# Patient Record
Sex: Male | Born: 1935 | Race: White | Hispanic: No | Marital: Married
Health system: Southern US, Community
[De-identification: ages and names within clinical notes are randomized; demographics above are authoritative.]

---

## 2005-06-28 ENCOUNTER — Ambulatory Visit: Payer: Self-pay | Admitting: Ophthalmology

## 2005-07-03 ENCOUNTER — Ambulatory Visit: Payer: Self-pay | Admitting: Ophthalmology

## 2009-03-24 ENCOUNTER — Ambulatory Visit: Payer: Self-pay | Admitting: Internal Medicine

## 2009-03-31 ENCOUNTER — Ambulatory Visit: Payer: Self-pay | Admitting: Internal Medicine

## 2009-04-07 ENCOUNTER — Ambulatory Visit: Payer: Self-pay | Admitting: Internal Medicine

## 2009-04-21 ENCOUNTER — Ambulatory Visit: Payer: Self-pay | Admitting: Internal Medicine

## 2009-04-28 ENCOUNTER — Ambulatory Visit: Payer: Self-pay | Admitting: Internal Medicine

## 2009-05-12 ENCOUNTER — Ambulatory Visit: Payer: Self-pay | Admitting: Internal Medicine

## 2009-05-19 ENCOUNTER — Ambulatory Visit: Payer: Self-pay | Admitting: Internal Medicine

## 2009-06-02 ENCOUNTER — Ambulatory Visit: Payer: Self-pay | Admitting: Internal Medicine

## 2009-06-09 ENCOUNTER — Ambulatory Visit: Payer: Self-pay | Admitting: Internal Medicine

## 2009-06-23 ENCOUNTER — Ambulatory Visit: Payer: Self-pay | Admitting: Internal Medicine

## 2009-07-07 ENCOUNTER — Ambulatory Visit: Payer: Self-pay | Admitting: Internal Medicine

## 2010-09-06 ENCOUNTER — Ambulatory Visit: Payer: Self-pay | Admitting: Unknown Physician Specialty

## 2012-08-23 ENCOUNTER — Ambulatory Visit: Payer: Self-pay | Admitting: Family Medicine

## 2012-09-01 ENCOUNTER — Ambulatory Visit: Payer: Self-pay

## 2013-07-06 ENCOUNTER — Ambulatory Visit: Payer: Self-pay | Admitting: General Surgery

## 2013-07-09 ENCOUNTER — Ambulatory Visit: Payer: Self-pay | Admitting: General Surgery

## 2013-08-02 ENCOUNTER — Ambulatory Visit: Payer: Self-pay | Admitting: General Surgery

## 2013-09-28 ENCOUNTER — Ambulatory Visit: Payer: Self-pay

## 2013-09-29 ENCOUNTER — Encounter: Payer: Self-pay | Admitting: Surgery

## 2013-09-30 ENCOUNTER — Encounter: Payer: Self-pay | Admitting: Surgery

## 2013-10-10 LAB — WOUND AEROBIC CULTURE

## 2013-12-03 ENCOUNTER — Encounter: Payer: Self-pay | Admitting: Surgery

## 2013-12-10 ENCOUNTER — Other Ambulatory Visit: Payer: Self-pay | Admitting: Surgery

## 2013-12-10 LAB — HEMOGLOBIN A1C: Hemoglobin A1C: 6.2 % (ref 4.2–6.3)

## 2013-12-22 ENCOUNTER — Inpatient Hospital Stay: Payer: Self-pay | Admitting: Specialist

## 2013-12-23 LAB — CBC WITH DIFFERENTIAL/PLATELET
BASOS PCT: 0.8 %
Basophil #: 0.1 10*3/uL (ref 0.0–0.1)
EOS PCT: 2.4 %
Eosinophil #: 0.2 10*3/uL (ref 0.0–0.7)
HCT: 28.4 % — ABNORMAL LOW (ref 40.0–52.0)
HGB: 9.5 g/dL — ABNORMAL LOW (ref 13.0–18.0)
Lymphocyte #: 1 10*3/uL (ref 1.0–3.6)
Lymphocyte %: 14.3 %
MCH: 30.5 pg (ref 26.0–34.0)
MCHC: 33.5 g/dL (ref 32.0–36.0)
MCV: 91 fL (ref 80–100)
Monocyte #: 0.6 x10 3/mm (ref 0.2–1.0)
Monocyte %: 9.1 %
NEUTROS ABS: 4.9 10*3/uL (ref 1.4–6.5)
Neutrophil %: 73.4 %
Platelet: 316 10*3/uL (ref 150–440)
RBC: 3.12 10*6/uL — ABNORMAL LOW (ref 4.40–5.90)
RDW: 12.6 % (ref 11.5–14.5)
WBC: 6.7 10*3/uL (ref 3.8–10.6)

## 2013-12-23 LAB — BASIC METABOLIC PANEL
Anion Gap: 5 — ABNORMAL LOW (ref 7–16)
BUN: 16 mg/dL (ref 7–18)
CO2: 26 mmol/L (ref 21–32)
Calcium, Total: 8.2 mg/dL — ABNORMAL LOW (ref 8.5–10.1)
Chloride: 104 mmol/L (ref 98–107)
Creatinine: 0.91 mg/dL (ref 0.60–1.30)
GLUCOSE: 81 mg/dL (ref 65–99)
Osmolality: 270 (ref 275–301)
POTASSIUM: 4 mmol/L (ref 3.5–5.1)
Sodium: 135 mmol/L — ABNORMAL LOW (ref 136–145)

## 2013-12-25 LAB — PATHOLOGY REPORT

## 2013-12-27 LAB — CULTURE, BLOOD (SINGLE)

## 2013-12-27 LAB — WOUND CULTURE

## 2014-05-05 ENCOUNTER — Emergency Department: Payer: Self-pay | Admitting: Emergency Medicine

## 2014-05-05 LAB — CBC
HCT: 36.7 % — ABNORMAL LOW (ref 40.0–52.0)
HGB: 11.8 g/dL — ABNORMAL LOW (ref 13.0–18.0)
MCH: 30.4 pg (ref 26.0–34.0)
MCHC: 32.3 g/dL (ref 32.0–36.0)
MCV: 94 fL (ref 80–100)
PLATELETS: 282 10*3/uL (ref 150–440)
RBC: 3.9 10*6/uL — AB (ref 4.40–5.90)
RDW: 13.1 % (ref 11.5–14.5)
WBC: 12.4 10*3/uL — AB (ref 3.8–10.6)

## 2014-05-05 LAB — COMPREHENSIVE METABOLIC PANEL
AST: 17 U/L (ref 15–37)
Albumin: 2.7 g/dL — ABNORMAL LOW (ref 3.4–5.0)
Alkaline Phosphatase: 164 U/L — ABNORMAL HIGH
Anion Gap: 7 (ref 7–16)
BILIRUBIN TOTAL: 0.7 mg/dL (ref 0.2–1.0)
BUN: 18 mg/dL (ref 7–18)
CALCIUM: 8.4 mg/dL — AB (ref 8.5–10.1)
CHLORIDE: 99 mmol/L (ref 98–107)
CO2: 26 mmol/L (ref 21–32)
Creatinine: 0.91 mg/dL (ref 0.60–1.30)
EGFR (African American): >60
Glucose: 109 mg/dL — ABNORMAL HIGH (ref 65–99)
OSMOLALITY: 267 (ref 275–301)
Potassium: 4.1 mmol/L (ref 3.5–5.1)
SGPT (ALT): 11 U/L — ABNORMAL LOW
Sodium: 132 mmol/L — ABNORMAL LOW (ref 136–145)
Total Protein: 6.7 g/dL (ref 6.4–8.2)

## 2014-05-05 LAB — URINALYSIS, COMPLETE
Bacteria: NONE SEEN
Bilirubin,UR: NEGATIVE
Blood: NEGATIVE
Glucose,UR: NEGATIVE mg/dL (ref 0–75)
Leukocyte Esterase: NEGATIVE
Nitrite: NEGATIVE
PH: 6 (ref 4.5–8.0)
Protein: NEGATIVE
RBC,UR: 1 /HPF (ref 0–5)
Specific Gravity: 1.019 (ref 1.003–1.030)
Squamous Epithelial: NONE SEEN
WBC UR: 1 /HPF (ref 0–5)

## 2014-05-05 LAB — LIPASE, BLOOD: Lipase: 47 U/L — ABNORMAL LOW (ref 73–393)

## 2014-05-06 ENCOUNTER — Inpatient Hospital Stay: Payer: Self-pay | Admitting: Podiatry

## 2014-05-06 LAB — CBC WITH DIFFERENTIAL/PLATELET
BASOS PCT: 0.2 %
Basophil #: 0 10*3/uL (ref 0.0–0.1)
Eosinophil #: 0 10*3/uL (ref 0.0–0.7)
Eosinophil %: 0 %
HCT: 34.9 % — ABNORMAL LOW (ref 40.0–52.0)
HGB: 11.3 g/dL — ABNORMAL LOW (ref 13.0–18.0)
Lymphocyte #: 0.2 10*3/uL — ABNORMAL LOW (ref 1.0–3.6)
Lymphocyte %: 1.4 %
MCH: 30.6 pg (ref 26.0–34.0)
MCHC: 32.4 g/dL (ref 32.0–36.0)
MCV: 94 fL (ref 80–100)
MONO ABS: 0.6 x10 3/mm (ref 0.2–1.0)
Monocyte %: 4.4 %
NEUTROS PCT: 94 %
Neutrophil #: 12.4 10*3/uL — ABNORMAL HIGH (ref 1.4–6.5)
Platelet: 243 10*3/uL (ref 150–440)
RBC: 3.7 10*6/uL — ABNORMAL LOW (ref 4.40–5.90)
RDW: 13.1 % (ref 11.5–14.5)
WBC: 13.2 10*3/uL — ABNORMAL HIGH (ref 3.8–10.6)

## 2014-05-06 LAB — COMPREHENSIVE METABOLIC PANEL
Albumin: 2.4 g/dL — ABNORMAL LOW (ref 3.4–5.0)
Alkaline Phosphatase: 157 U/L — ABNORMAL HIGH
Anion Gap: 10 (ref 7–16)
BUN: 25 mg/dL — ABNORMAL HIGH (ref 7–18)
Bilirubin,Total: 1 mg/dL (ref 0.2–1.0)
CALCIUM: 7.9 mg/dL — AB (ref 8.5–10.1)
CO2: 25 mmol/L (ref 21–32)
Chloride: 96 mmol/L — ABNORMAL LOW (ref 98–107)
Creatinine: 1.14 mg/dL (ref 0.60–1.30)
EGFR (African American): >60
EGFR (Non-African Amer.): >60
Glucose: 131 mg/dL — ABNORMAL HIGH (ref 65–99)
OSMOLALITY: 269 (ref 275–301)
Potassium: 4.2 mmol/L (ref 3.5–5.1)
SGOT(AST): 32 U/L (ref 15–37)
SGPT (ALT): 17 U/L
SODIUM: 131 mmol/L — AB (ref 136–145)
Total Protein: 6.3 g/dL — ABNORMAL LOW (ref 6.4–8.2)

## 2014-05-06 LAB — SEDIMENTATION RATE: Erythrocyte Sed Rate: 58 mm/hr — ABNORMAL HIGH (ref 0–20)

## 2014-05-07 LAB — CBC WITH DIFFERENTIAL/PLATELET
Basophil #: 0 10*3/uL (ref 0.0–0.1)
Basophil %: 0.1 %
EOS ABS: 0 10*3/uL (ref 0.0–0.7)
Eosinophil %: 0 %
HCT: 31.4 % — ABNORMAL LOW (ref 40.0–52.0)
HGB: 10.5 g/dL — ABNORMAL LOW (ref 13.0–18.0)
LYMPHS ABS: 0.2 10*3/uL — AB (ref 1.0–3.6)
Lymphocyte %: 2.2 %
MCH: 31.5 pg (ref 26.0–34.0)
MCHC: 33.5 g/dL (ref 32.0–36.0)
MCV: 94 fL (ref 80–100)
Monocyte #: 0.4 x10 3/mm (ref 0.2–1.0)
Monocyte %: 4 %
NEUTROS ABS: 9.8 10*3/uL — AB (ref 1.4–6.5)
Neutrophil %: 93.7 %
Platelet: 191 10*3/uL (ref 150–440)
RBC: 3.34 10*6/uL — ABNORMAL LOW (ref 4.40–5.90)
RDW: 13.4 % (ref 11.5–14.5)
WBC: 10.4 10*3/uL (ref 3.8–10.6)

## 2014-05-07 LAB — BASIC METABOLIC PANEL
ANION GAP: 9 (ref 7–16)
BUN: 26 mg/dL — AB (ref 7–18)
CHLORIDE: 96 mmol/L — AB (ref 98–107)
CO2: 25 mmol/L (ref 21–32)
Calcium, Total: 7.7 mg/dL — ABNORMAL LOW (ref 8.5–10.1)
Creatinine: 1.08 mg/dL (ref 0.60–1.30)
Glucose: 92 mg/dL (ref 65–99)
OSMOLALITY: 265 (ref 275–301)
Potassium: 3.8 mmol/L (ref 3.5–5.1)
Sodium: 130 mmol/L — ABNORMAL LOW (ref 136–145)

## 2014-05-08 DIAGNOSIS — I341 Nonrheumatic mitral (valve) prolapse: Secondary | ICD-10-CM

## 2014-05-08 LAB — BASIC METABOLIC PANEL
Anion Gap: 7 (ref 7–16)
BUN: 26 mg/dL — ABNORMAL HIGH (ref 7–18)
Calcium, Total: 7.6 mg/dL — ABNORMAL LOW (ref 8.5–10.1)
Chloride: 96 mmol/L — ABNORMAL LOW (ref 98–107)
Co2: 26 mmol/L (ref 21–32)
Glucose: 86 mg/dL (ref 65–99)
Osmolality: 263 (ref 275–301)
Potassium: 3.7 mmol/L (ref 3.5–5.1)
Sodium: 129 mmol/L — ABNORMAL LOW (ref 136–145)

## 2014-05-08 LAB — CREATININE, SERUM
Creatinine: 1.09 mg/dL (ref 0.60–1.30)
EGFR (African American): >60
EGFR (Non-African Amer.): >60

## 2014-05-09 LAB — URINALYSIS, COMPLETE
BACTERIA: NONE SEEN
Bilirubin,UR: NEGATIVE
Blood: NEGATIVE
Glucose,UR: NEGATIVE mg/dL (ref 0–75)
Ketone: NEGATIVE
LEUKOCYTE ESTERASE: NEGATIVE
Nitrite: NEGATIVE
Ph: 5 (ref 4.5–8.0)
Protein: 30
RBC,UR: 5 /HPF (ref 0–5)
SPECIFIC GRAVITY: 1.033 (ref 1.003–1.030)
SQUAMOUS EPITHELIAL: NONE SEEN

## 2014-05-09 LAB — BASIC METABOLIC PANEL WITH GFR
Anion Gap: 9
BUN: 21 mg/dL — ABNORMAL HIGH
Calcium, Total: 7.4 mg/dL — ABNORMAL LOW
Chloride: 96 mmol/L — ABNORMAL LOW
Co2: 24 mmol/L
Creatinine: 0.94 mg/dL
EGFR (African American): >60
EGFR (Non-African Amer.): >60
Glucose: 98 mg/dL
Osmolality: 262
Potassium: 3.7 mmol/L
Sodium: 129 mmol/L — ABNORMAL LOW

## 2014-05-09 LAB — CBC WITH DIFFERENTIAL/PLATELET
Basophil #: 0 x10 3/mm 3
Basophil %: 0.2 %
Eosinophil #: 0.1 x10 3/mm 3
Eosinophil %: 0.7 %
HCT: 32 % — ABNORMAL LOW
HGB: 10.8 g/dL — ABNORMAL LOW
Lymphocyte %: 4.7 %
Lymphs Abs: 0.3 x10 3/mm 3 — ABNORMAL LOW
MCH: 31.4 pg
MCHC: 33.8 g/dL
MCV: 93 fL
Monocyte #: 0.4 "x10 3/mm "
Monocyte %: 5.2 %
Neutrophil #: 6.5 x10 3/mm 3
Neutrophil %: 89.2 %
Platelet: 192 x10 3/mm 3
RBC: 3.44 x10 6/mm 3 — ABNORMAL LOW
RDW: 13.2 %
WBC: 7.3 x10 3/mm 3

## 2014-05-09 LAB — CULTURE, BLOOD (SINGLE)

## 2014-05-10 LAB — BASIC METABOLIC PANEL
Anion Gap: 8 (ref 7–16)
BUN: 18 mg/dL (ref 7–18)
CALCIUM: 7.6 mg/dL — AB (ref 8.5–10.1)
CHLORIDE: 98 mmol/L (ref 98–107)
Co2: 27 mmol/L (ref 21–32)
Creatinine: 0.96 mg/dL (ref 0.60–1.30)
EGFR (African American): >60
EGFR (Non-African Amer.): >60
Glucose: 89 mg/dL (ref 65–99)
OSMOLALITY: 268 (ref 275–301)
POTASSIUM: 3.7 mmol/L (ref 3.5–5.1)
Sodium: 133 mmol/L — ABNORMAL LOW (ref 136–145)

## 2014-05-10 LAB — WOUND CULTURE

## 2014-05-10 LAB — VANCOMYCIN, TROUGH: Vancomycin, Trough: 12 ug/mL (ref 10–20)

## 2014-05-11 LAB — WOUND CULTURE

## 2014-05-12 LAB — CULTURE, BLOOD (SINGLE)

## 2014-05-14 ENCOUNTER — Ambulatory Visit: Payer: Self-pay | Admitting: Family Medicine

## 2014-05-14 LAB — COMPREHENSIVE METABOLIC PANEL
ALK PHOS: 202 U/L — AB
Albumin: 1.8 g/dL — ABNORMAL LOW (ref 3.4–5.0)
Anion Gap: 11 (ref 7–16)
BUN: 17 mg/dL (ref 7–18)
Bilirubin,Total: 0.8 mg/dL (ref 0.2–1.0)
CHLORIDE: 99 mmol/L (ref 98–107)
Calcium, Total: 7.2 mg/dL — ABNORMAL LOW (ref 8.5–10.1)
Co2: 24 mmol/L (ref 21–32)
Creatinine: 0.69 mg/dL (ref 0.60–1.30)
EGFR (African American): >60
EGFR (Non-African Amer.): >60
GLUCOSE: 71 mg/dL (ref 65–99)
OSMOLALITY: 268 (ref 275–301)
Potassium: 4.4 mmol/L (ref 3.5–5.1)
SGOT(AST): 29 U/L (ref 15–37)
SGPT (ALT): 20 U/L
Sodium: 134 mmol/L — ABNORMAL LOW (ref 136–145)
Total Protein: 4.8 g/dL — ABNORMAL LOW (ref 6.4–8.2)

## 2014-05-14 LAB — CBC WITH DIFFERENTIAL/PLATELET
BASOS ABS: 0.1 10*3/uL (ref 0.0–0.1)
BASOS PCT: 0.7 %
EOS ABS: 0.1 10*3/uL (ref 0.0–0.7)
Eosinophil %: 1 %
HCT: 34.3 % — ABNORMAL LOW (ref 40.0–52.0)
HGB: 11.4 g/dL — ABNORMAL LOW (ref 13.0–18.0)
Lymphocyte #: 0.8 10*3/uL — ABNORMAL LOW (ref 1.0–3.6)
Lymphocyte %: 7.1 %
MCH: 30.8 pg (ref 26.0–34.0)
MCHC: 33.3 g/dL (ref 32.0–36.0)
MCV: 92 fL (ref 80–100)
MONO ABS: 0.9 x10 3/mm (ref 0.2–1.0)
Monocyte %: 7.7 %
Neutrophil #: 9.2 10*3/uL — ABNORMAL HIGH (ref 1.4–6.5)
Neutrophil %: 83.5 %
PLATELETS: 323 10*3/uL (ref 150–440)
RBC: 3.71 10*6/uL — AB (ref 4.40–5.90)
RDW: 13.6 % (ref 11.5–14.5)
WBC: 11.1 10*3/uL — ABNORMAL HIGH (ref 3.8–10.6)

## 2014-05-14 LAB — VANCOMYCIN, TROUGH: Vancomycin, Trough: 11 ug/mL (ref 10–20)

## 2014-05-31 ENCOUNTER — Inpatient Hospital Stay: Payer: Self-pay | Admitting: Internal Medicine

## 2014-05-31 LAB — CBC WITH DIFFERENTIAL/PLATELET
Basophil #: 0 10*3/uL (ref 0.0–0.1)
Basophil %: 0.3 %
Eosinophil #: 0 10*3/uL (ref 0.0–0.7)
Eosinophil %: 0.3 %
HCT: 33.8 % — AB (ref 40.0–52.0)
HGB: 10.9 g/dL — AB (ref 13.0–18.0)
LYMPHS ABS: 0.6 10*3/uL — AB (ref 1.0–3.6)
LYMPHS PCT: 6.2 %
MCH: 30.1 pg (ref 26.0–34.0)
MCHC: 32.1 g/dL (ref 32.0–36.0)
MCV: 94 fL (ref 80–100)
Monocyte #: 0.7 x10 3/mm (ref 0.2–1.0)
Monocyte %: 7.1 %
NEUTROS ABS: 8 10*3/uL — AB (ref 1.4–6.5)
NEUTROS PCT: 86.1 %
Platelet: 454 10*3/uL — ABNORMAL HIGH (ref 150–440)
RBC: 3.61 10*6/uL — ABNORMAL LOW (ref 4.40–5.90)
RDW: 13.1 % (ref 11.5–14.5)
WBC: 9.3 10*3/uL (ref 3.8–10.6)

## 2014-05-31 LAB — BASIC METABOLIC PANEL
Anion Gap: 10 (ref 7–16)
BUN: 21 mg/dL — AB (ref 7–18)
CALCIUM: 8.3 mg/dL — AB (ref 8.5–10.1)
CHLORIDE: 99 mmol/L (ref 98–107)
Co2: 24 mmol/L (ref 21–32)
Creatinine: 1.03 mg/dL (ref 0.60–1.30)
GLUCOSE: 110 mg/dL — AB (ref 65–99)
OSMOLALITY: 270 (ref 275–301)
Potassium: 3.8 mmol/L (ref 3.5–5.1)
SODIUM: 133 mmol/L — AB (ref 136–145)

## 2014-06-01 LAB — CREATININE, SERUM
CREATININE: 0.9 mg/dL (ref 0.60–1.30)
EGFR (Non-African Amer.): >60

## 2014-06-03 LAB — CREATININE, SERUM
Creatinine: 0.84 mg/dL (ref 0.60–1.30)
EGFR (Non-African Amer.): >60

## 2014-06-03 LAB — VANCOMYCIN, TROUGH: VANCOMYCIN, TROUGH: 16 ug/mL (ref 10–20)

## 2014-06-04 LAB — CULTURE, BLOOD (SINGLE)

## 2014-06-05 LAB — VANCOMYCIN, TROUGH: VANCOMYCIN, TROUGH: 14 ug/mL (ref 10–20)

## 2014-06-06 LAB — CULTURE, BLOOD (SINGLE)

## 2014-06-07 LAB — CREATININE, SERUM: CREATININE: 0.83 mg/dL (ref 0.60–1.30)

## 2014-06-07 LAB — VANCOMYCIN, TROUGH: Vancomycin, Trough: 23 ug/mL (ref 10–20)

## 2014-06-07 LAB — CULTURE, BLOOD (SINGLE)

## 2014-06-08 LAB — CULTURE, BLOOD (SINGLE)

## 2014-06-11 LAB — CULTURE, BLOOD (SINGLE)

## 2014-07-11 ENCOUNTER — Emergency Department: Payer: Self-pay | Admitting: Student

## 2014-07-11 LAB — COMPREHENSIVE METABOLIC PANEL
ALBUMIN: 2.3 g/dL — AB (ref 3.4–5.0)
ALK PHOS: 202 U/L — AB
ALT: 11 U/L — AB
ANION GAP: 7 (ref 7–16)
AST: 23 U/L (ref 15–37)
BUN: 18 mg/dL (ref 7–18)
Bilirubin,Total: 0.8 mg/dL (ref 0.2–1.0)
CALCIUM: 8 mg/dL — AB (ref 8.5–10.1)
Chloride: 105 mmol/L (ref 98–107)
Co2: 29 mmol/L (ref 21–32)
Creatinine: 0.96 mg/dL (ref 0.60–1.30)
EGFR (Non-African Amer.): >60
GLUCOSE: 100 mg/dL — AB (ref 65–99)
Osmolality: 283 (ref 275–301)
Potassium: 3.4 mmol/L — ABNORMAL LOW (ref 3.5–5.1)
Sodium: 141 mmol/L (ref 136–145)
TOTAL PROTEIN: 6 g/dL — AB (ref 6.4–8.2)

## 2014-07-11 LAB — PROTIME-INR
INR: 1.2
PROTHROMBIN TIME: 14.7 s (ref 11.5–14.7)

## 2014-07-11 LAB — CBC
HCT: 35.5 % — AB (ref 40.0–52.0)
HGB: 11.4 g/dL — AB (ref 13.0–18.0)
MCH: 28.7 pg (ref 26.0–34.0)
MCHC: 32 g/dL (ref 32.0–36.0)
MCV: 90 fL (ref 80–100)
Platelet: 288 10*3/uL (ref 150–440)
RBC: 3.95 10*6/uL — AB (ref 4.40–5.90)
RDW: 15.8 % — ABNORMAL HIGH (ref 11.5–14.5)
WBC: 8.2 10*3/uL (ref 3.8–10.6)

## 2014-07-11 LAB — CK TOTAL AND CKMB (NOT AT ARMC)
CK, TOTAL: 27 U/L — AB (ref 39–308)
CK-MB: 1 ng/mL (ref 0.5–3.6)

## 2014-07-11 LAB — PRO B NATRIURETIC PEPTIDE: B-Type Natriuretic Peptide: 30100 pg/mL — ABNORMAL HIGH (ref 0–450)

## 2014-07-11 LAB — APTT: Activated PTT: 26.6 secs (ref 23.6–35.9)

## 2014-07-11 LAB — TROPONIN I: TROPONIN-I: 0.08 ng/mL — AB

## 2014-10-23 NOTE — Consult Note (Signed)
PATIENT NAME:  Jordan [name], Rui A MR#:  [id number] DATE OF BIRTH:  11-13-1918  DATE OF CONSULTATION:  05/07/2014  REFERRING PHYSICIAN:    CONSULTING PHYSICIAN:  Stann Mainlandavid P. Sampson GoonFitzgerald, MD  REQUESTING PHYSICIAN:  Yetta GlassmanSital P Mody, MD   REASON FOR CONSULT: Osteomyelitis and sepsis.   HISTORY OF PRESENT ILLNESS:  This is a pleasant 81 year old gentleman with history of recent amputation of the left great toe due to acute underlying osteomyelitis and ulceration. This was in June. Cultures as an outpatient grew Staphylococcus aureus, methicillin sensitive, Proteus and group B strep. He was treated with Zosyn, then oral antibiotics; however, wounds progress ED and he now has infection of his left second toe. He was readmitted on November 4, with fevers, chills and worsening infection. There was evidence of underlying osteomyelitis. On  November 6, he underwent transmetatarsal amputation and tendon Achilles lengthening on the left lower leg. He has been on IV antibiotics. We are consulted for further antibiotic management.   PAST MEDICAL HISTORY:  Left great toe ulcer and osteomyelitis status post amputation in June, right foot ulceration, hyperlipidemia, left eye stroke and melanoma history.     PAST SURGICAL HISTORY: As above.   ALLERGIES: HE IS ALLERGIC TO SULFA:    FAMILY HISTORY: Noncontributory.   SOCIAL HISTORY: No smoking, alcohol or drugs. He helps to take care of his wife who is wheelchair bound and lives with her.   FAMILY HISTORY: Noncontributory.   ANTIBIOTICS SINCE ADMISSION: Include Zosyn and vancomycin.   REVIEW OF SYSTEMS:  Eleven systems reviewed and negative except as per history of present illness.   PHYSICAL EXAMINATION: VITAL SIGNS: Temperature 99, pulse 83, blood pressure 131/68, respirations 18, sat 93%.  GENERAL: He is thin. He is relatively healthy appearing for his age; he is somewhat tired appearing.  HEENT: Pupils equal, round, and reactive to light and accommodation,  oropharynx is clear.  NECK: Supple.  HEART: Regular.  LUNGS: Clear.  ABDOMEN: Soft, nontender.  LEFT LOWER EXTREMITY: Status post transmetatarsal amputation with a balloon pump in place, his heel and Achilles has sutures in place. There is no erythema over the site. His right leg is wrapped with his ankle ulceration.  NEUROLOGIC: He is alert and oriented x 3, grossly nonfocal neuro exam.   DATA: White blood count on admission was 12.4, currently it is 10.4, hemoglobin 10.5, platelets 191,000, citrate 58, LFTs within normal limits, except alkaline phosphatase up somewhat at 157; renal function shows a creatinine of 1.08, blood cultures 2 of 2 are growing gram-positive cocci; wound culture from the left foot is growing moderate growth of staph aureus, repeat blood cultures November 6, are pending, and no growth to date.   IMAGING: Left foot x-ray November 5, showed osteomyelitis involving the second metatarsal. CT of the abdomen and pelvis without contrast November 4, showed chronic compression fracture of L1.   IMPRESSION: A 81 year old gentleman now status post transmetatarsal amputation November 6, for osteomyelitis of the second toe, following amputation in June, for osteomyelitis of the left great toe. Cultures at that time grew MSSA, Proteus and group B strep. He now has gram-positive bacteremia and Staphylococcus aureus growing from his wound. I suspect he has Staphylococcus aureus bacteremia.   RECOMMENDATIONS: 1.  Continue current antibiotics until culture is finalized.  2.  He will need a PICC line placed once he clears his cultures. If he has Staphylococcus aureus bacteremia, he will need at least 2 weeks of IV antibiotics, and then probable further antibiotics tailored to  his cultures.   Thank you for the consult. I will be glad to follow with you.    ____________________________ Stann Mainland. Sampson Goon, MD dpf:nt D: 05/07/2014 21:41:50 ET T: 05/07/2014 23:38:09  ET JOB#: [id number]  cc: Stann Mainland. Sampson Goon, MD, <Dictator> DAVID Sampson Goon MD ELECTRONICALLY SIGNED 05/11/2014 17:38

## 2014-10-23 NOTE — Op Note (Signed)
PATIENT NAME:  Jordan [name], Jordan [name] MR#:  [id number] DATE OF BIRTH:  1919-04-09  DATE OF PROCEDURE:  05/07/2014  PREOPERATIVE DIAGNOSES:  1.  Left forefoot osteomyelitis.  2.  Equinus, left lower extremity.  POSTOPERATIVE DIAGNOSES: 1.  Left forefoot osteomyelitis.  2.  Equinus, left lower extremity.    PROCEDURE:  1.  Transmetatarsal amputation, left foot.  2.  Tendo Achilles lengthening, left lower leg.   SURGEON: Felita Bump [name]. Ether GriffinsFowler, DPM   ANESTHESIA: IV sedation with local.   HEMOSTASIS: Mid calf tourniquet inflated to 250 mmHg for 15 minutes.   COMPLICATIONS: None.   SPECIMEN: Bone for culture, and transmetatarsal amputation site for pathology.   ESTIMATED BLOOD LOSS: 25 mL   OPERATIVE INDICATIONS: [name] admitted with infection in his left foot. He has long-standing neuropathy. He had noted osteomyelitis on x-ray. We discussed transmetatarsal amputation and tendo Achilles lengthening, and consent has been given.   DESCRIPTION OF PROCEDURE: The left lower extremity was prepped and draped in the usual sterile fashion. Attention was directed to the posterior aspect of the Achilles tendon, where 3 percutaneous hemisections were performed to the Achilles tendon. This was at approximately 1, 3 and 5 cm from the insertion. Good excursion was noted with marked decrease of pressure to the forefoot. These were then closed with 2-0 nylon.   Attention was then directed to the forefoot region where, after inflation of the tourniquet, 2 semielliptical incisions were made from medial to lateral from the first to fifth metatarsal regions. Full thickness fishmouth incision was then performed. This was taken down to the metatarsals. The metatarsals then were removed along the surgical neck proximal to the metatarsal heads. Good alignment was noted after amputation. The remainder of the forefoot was removed en toto.   The wound was flushed with copious amounts of irrigation. No  severe purulent drainage was noted into the midfoot.   The tourniquet was then released. All bleeders were Bovie cauterized. Layered closure was then performed with [name] for the deeper layer and [name] for skin. An incisional type wound VAC was then placed along the transmetatarsal amputation site. [name] was placed to the posterior aspect of the Achilles region.   The patient will be readmitted back to the floor. He will be nonweightbearing to this left side. We will re-evaluate in the next 2-3 days.    ____________________________ Argentina DonovanJustin [name]. Ether GriffinsFowler, DPM jaf:MT D: 05/07/2014 08:56:50 ET T: 05/07/2014 10:21:57 ET JOB#: [id number]  cc: Jill AlexandersJustin [name]. Ether GriffinsFowler, DPM, <Dictator> Khamron Gellert DPM ELECTRONICALLY SIGNED 05/14/2014 11:50

## 2014-10-23 NOTE — H&P (Signed)
PATIENT NAME:  Jordan Jordan [name], Jordan Jordan [name] DATE OF BIRTH:  1918/12/13  DATE OF ADMISSION:  12/22/2013  PRIMARY CARE PHYSICIAN:  Jordan Dearavid N. Harrington Challengerhies, MD  PRIMARY PODIATRIST:  Jordan Jordan [name], DPM  CHIEF COMPLAINT: Left foot ulcer.   HISTORY OF PRESENT ILLNESS: This is Jordan [name] very pleasant 81 year old male who presents as Jordan [name] direct admit from Dr. Irene Jordan [name]'s office with Jordan [name] left foot ulcer needing surgery. The patient has been at the wound care and also as an his outpatient been treated for his left foot ulcer as well as some right feet ulcers but the left foot ulcer is not improving and will need surgery.     REVIEW OF SYSTEMS: CONSTITUTIONAL: No fever, fatigue, weakness.  EYES: No blurred or double vision, glaucoma.  ENT:  No ear pain, hearing loss, seasonal allergies, postnasal drip.   RESPIRATORY:  No cough, wheezing, hemoptysis, dyspnea.  CARDIOVASCULAR:  No chest pain, orthopnea, edema, arrhythmia, dyspnea on exertion, palpitations.   GASTROINTESTINAL:  No nausea, vomiting, diarrhea, abdominal pain, melena or ulcers.   GENITOURINARY: No dysuria or hematuria.  ENDOCRINE: No polyuria or polydipsia.   HEMATOLOGY AND LYMNPHATIC:  No bleeding, swollen glands.  SKIN: No rash or lesions. He does have these ulcers on his feet.  MUSCULOSKELETAL: No arthritis. NEUROLOGICAL:  No history of cerebrovascular accident, transient ischemic attack or seizures.  PSYCHIATRIC: No history anxiety or depression.  PAST MEDICAL HISTORY:   1.  Occlusion of the left eye.   2.  Psoriasis.  3.  History of skin cancer.   MEDICATIONS:   1.  Aggrenox 1 tablet p.o. b.i.d.  2.  Zocor 20 mg at bedtime.   PAST SURGICAL HISTORY: 1.  Elbow surgery.  2.  Tonsillectomy.   SOCIAL HISTORY:  No  tobacco or alcohol drinking.   ALLERGIES: SULFA.   FAMILY HISTORY: Positive for lung cancer.   PHYSICAL EXAMINATION: VITAL SIGNS: Temperature 97.7, pulse is 72, respirations 18, blood pressure 117/68, 98% on room air.   GENERAL: The patient is alert, oriented, not in acute distress.  HEENT: Head is atraumatic. Pupils equal and reactive.  Sclerae anicteric.  Mucous membranes are moist. Oropharynx is clear. NECK: Supple without JVD, carotid bruits or enlarged thyroid.  CARDIOVASCULAR: Regular rate and rhythm. No murmurs, gallops or rubs. PMI is not displaced.  RESPIRATORY:  Clear to auscultation without crackles, rales, rhonchi or wheezing. Normal to percussion.  ABDOMEN: Bowel sounds are positive. Nontender, nondistended. No hepatosplenomegaly.  EXTREMITIES: He has got 2+ pitting edema in the left lower extremity with redness and erythema on the left foot.  His left foot is actually covered.  SKIN:  Right foot has two small ulcers. Left foot I did not undress the dressing as it was just wrapped.   LABORATORY, DIAGNOSTIC AND RADIOLOGICAL DATA:  No labs were done at this time.   ASSESSMENT AND PLAN: Jordan [name] 81 year old male who was Jordan [name] direct from Dr. Irene Jordan [name]'s office with Jordan [name] left foot ulcer needing surgery.  1.  Left foot ulcer. The patient will undergo surgery tomorrow. He will be n.p.o. and placed on broad-spectrum antibiotics including Zosyn.  2.  History of left eye occlusion. The patient is on Aggrenox and Zocor which we will continue.  3.  The patient is Jordan [name] full CODE STATUS.   TIME SPENT: 40 minutes.   ____________________________ Jordan ContesSital P. Juliene PinaMody, MD spm:cs D: 12/22/2013 13:12:00 ET T: 12/22/2013 14:18:47 ET JOB#: [id number]  cc: Jordan P. Juliene PinaMody, MD, <Dictator> Jordan Jordan [name], DPM Jordan Dearavid N. Parkhies,  MD  Jordan Contes MODY MD ELECTRONICALLY SIGNED 12/22/2013 15:30

## 2014-10-23 NOTE — Consult Note (Signed)
PATIENT NAME:  Jordan [name], Jordan [name] MR#:  [id number] DATE OF BIRTH:  1918/10/11  DATE OF CONSULTATION:  12/23/2013  REQUESTING PHYSICIAN:  Dr. Samara Deist CONSULTING PHYSICIAN:  Cheral Marker. Ola Spurr, MD  REASON FOR CONSULTATION:  Osteomyelitis.   HISTORY OF PRESENT ILLNESS: This is a very pleasant 81 year old gentleman, who is a former [employer] manager, fast food.  He has been following with Dr. Elvina Mattes in dealing with a great toe ulcer since June 1st. He was referred by Dr. Ezequiel Kayser. He had also been followed at the wound care center for a right ankle ulcer. 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 He also has a history of a relatively recent resection of a malignant melanoma from his forehead. The patient had cultures done and was started on Augmentin on June 1st.  He did have some debridement of the tissue.  However, the infection progressed and it was felt he needed more definitive surgery. There was exposed bone. The patient initially could not be admitted due to having to provide care for his wife, but she has been since admitted to a skilled nursing facility so he can have surgery. He underwent amputation of the left great toe, 1st ray, and debridement of his right ankle ulceration. The patient has been afebrile since admission and has no other complaints.   PAST MEDICAL HISTORY:  1. Peripheral neuropathy of unknown etiology.  2. Melanoma, recently resected.  3. Osteoarthritis.  4. Psoriasis.  5. COPD.  6. 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  7. Hyperlipidemia.  8. Varicose veins.  9. Venous stasis dermatitis.  10. Right hydrocele.   PAST SURGICAL HISTORY: ORIF of elbow fracture in 2012 and tonsillectomy.   FAMILY HISTORY:  Noncontributory.   SOCIAL HISTORY:  He is married. He has 2 children who live out of state. He is a former smoker. 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   ALLERGIES: HE IS  ALLERGIC TO SULFA, BUT HE SAYS THAT WAS BACK IN 1942.  HE HAS NOT HAD ANY SULFA DRUGS SINCE THEN.  REVIEW OF SYSTEMS:  Eleven systems were reviewed and negative except as per HPI.   ANTIBIOTICS SINCE ADMISSION: Include Zosyn.   PHYSICAL EXAMINATION:  VITAL SIGNS: Temperature 97.8, pulse 73, blood pressure 113/62, respirations 18, saturation 96% on room air.  GENERAL: He is pleasant, interactive, in no acute distress.  HEENT: Pupils equal, round, reactive to light and accommodation. Extraocular movements are intact. Sclerae are anicteric. Oropharynx is clear.  NECK: Supple.  HEART: Regular.  LUNGS: Clear.  ABDOMEN: Soft, nontender.  EXTREMITIES: His right ankle has a wound VAC medially.  He also has a lateral wound that is just a shallow ulceration. His left foot is postoperatively wrapped. It is not examined due to him just returning from the OR.   LABORATORY DATA: White blood count on admission 86.7, hemoglobin 9.5, platelets 316,000. Blood cultures x 2 are no growth to date.  The renal function shows a creatinine of 0.91. Outpatient cultures done on April 7th of the right ankle showed group B strep and aerobic gram-positive rod not further identified and methicillin sensitive Staphylococcus aureus. A1c was 6.2. Another culture   that has been of the foot from June 1st, grew methicillin-sensitive Staphylococcus aureus as well as group B strep and Proteus. The Proteus was pansensitive except to tetracycline and the Staphylococcus aureus was also pan sensitive.   IMPRESSION: A 81 year old quite  healthy gentleman. He was living alone caring for his disabled wife.  He has a history of idiopathic peripheral neuropathy and developed a right ankle ulcer that is not healing as well as left great toe ulceration and osteomyelitis. He is now status post 1st ray amputation and further debridement. Cultures are growing methicillin-sensitive Staphylococcus aureus and Proteus mirabilis, and group B strep. ESR  done prior to admission was 108 on June 3rd.  He has failed outpatient antibiotics.  RECOMMENDATIONS: 1. We will discuss further with Dr. Vickki Muff. I suspect he would be best served with IV antibiotics for at least 2 weeks to clean up the residual infection and then followed by oral antibiotics. His sedimentation rate was quite elevated, especially for small bone infection, so I suspect there is a lot of surrounding inflammation.  2. Local wound care per Dr. Vickki Muff.  3. The infection is likely polymicrobial; however, the main pathogen seems to be the methicillin-sensitive Staphylococcus aureus and the group B strep. There would be no need for vancomycin. Possible option is to continue relatively broad coverage of a mixed infection that could be giving relatively easily would include Zosyn, which can be given via pump at home or at a facility. 4. Thanks for the consult.  I will be glad to follow with you.   ____________________________ Cheral Marker. Ola Spurr, MD dpf:dd D: 12/23/2013 22:33:35 ET T: 12/24/2013 03:36:48 ET JOB#: [id number]  cc: Cheral Marker. Ola Spurr, MD, <Dictator> DAVID Ola Spurr MD ELECTRONICALLY SIGNED 12/24/2013 13:31

## 2014-10-23 NOTE — Discharge Summary (Signed)
PATIENT NAME:  Jordan LeatherwoodOLSON, Storm A MR#:  [id number] DATE OF BIRTH:  Nov 18, 1918  DATE OF ADMISSION:  05/06/2014 DATE OF DISCHARGE:  05/11/2014  ADMITTING DIAGNOSES:  1. Left foot osteomyelitis with sepsis due to methicillin-resistant Staphylococcus aureus.  2. Left forefoot osteomyelitis, status post transmetatarsal amputation of the left foot, as well as tendo Achilles lengthening of the left leg.  3. Methicillin-resistant Staphylococcus aureus bacteremia due to a left foot infection, as well as Pseudomonas infection in the wound, status post infectious disease evaluation.  4. Hyperlipidemia.  5. History of cerebrovascular accident.  6. Hyponatremia.  7. Hyperlipidemia.  8. History of melanoma.  9. History of amputation of the big toe.  10. History of melanoma removed from the head.   CONSULTANTS: Dr. Ether GriffinsFowler, Dr. Sampson GoonFitzgerald.   PERTINENT LABORATORIES AND EVALUATIONS: Admitting glucose 109, BUN 18, creatinine 0.91, sodium 132, potassium 4.1, chloride 99, CO2 was 26, calcium was 8.6. LFTs showed a total protein of 6.7, albumin 2.7, AST was 11. WBC 12.4, hemoglobin 11.8, platelet count was 282,000.   Blood cultures showed methicillin-resistant Staphylococcus aureus.  Wound culture showed MRSA, as well as Pseudomonas.   Blood culture repeated on November 6 was negative.   Echocardiogram showed no evidence of endocarditis.   HOSPITAL COURSE: Please refer to the history and physical done by the admitting physician. The patient is a 81 year old, white male, who was treated outpatient by Dr. Ether GriffinsFowler for infection of the foot, who had a recent toe amputation of a left foot great toe, who presented to the ED with a fall, but was noted to have a second toe that was infected. The patient was noted to have tachycardia and clinical sepsis on presentation. He was admitted for further evaluation and treatment. The patient was started on broad-spectrum antibiotics and was seen by podiatry, and he was taken to  the OR the next day. The patient had left foot osteomyelitis. He underwent transmetatarsal amputation of the left foot. The patient's blood cultures were positive for MRSA, wound cultures for MRSA as well as Pseudomonas. The patient was treated with broad-spectrum antibiotics and followed by ID. Currently, he is doing much better. His wound VAC is removed. He is arranged for IV antibiotics at the skilled nursing facility.  MEDICATIONS AT THE TIME OF DISCHARGE: Simvastatin 20 at bedtime, Centrum Silver 1 tablet p.o. daily, Aggrenox 2 tablets daily, acetaminophen/oxycodone 325/5 mg 1 tablet p.o. q.6 h. p.r.n. for pain, Tylenol 650 q.4 h. p.r.n., Lovenox 40 mg every 24 hours x 10 days, ibuprofen 200 q.8 h. p.r.n. for pain. Vancomycin  grams IV q.24 h., stop date November 20. Cipro 500 one tablet p.o. q.12 h. x 10 days, Colace 1 tablet p.o. b.i.d., MiraLax 17 grams daily.   DISCHARGE DIET: Low-sodium, low-fat, low-cholesterol.   DISCHARGE ACTIVITY: Heel touchdown for transfer of the left foot. PT evaluation and treatment.   DISCHARGE FOLLOWUP: Follow up with the M.D. at the skilled nursing facility in 1 to 2 weeks. Follow up with Dr. Ether GriffinsFowler in 2 to 4 weeks. Follow up with Dr. Sampson GoonFitzgerald in 2 to 4 weeks. The patient is to have an every other day dry dressing to the left foot.    TIME SPENT ON THIS DISCHARGE: 45 minutes.    ____________________________ Lacie ScottsShreyang H. Allena KatzPatel, MD shp:JT D: 05/11/2014 12:29:16 ET T: 05/11/2014 13:03:35 ET JOB#: [id number]  cc: Breeanna Galgano H. Allena KatzPatel, MD, <Dictator> Charise CarwinSHREYANG H Gio Janoski MD ELECTRONICALLY SIGNED 05/19/2014 19:30

## 2014-10-23 NOTE — H&P (Signed)
PATIENT NAME:  Jordan [name], Jordan [name] MR#:  [id number] DATE OF BIRTH:  1919/04/22  DATE OF ADMISSION:  05/31/2014  This is a 81 year old male sent in from Dr. Jarrett Ables office for direct admission for MRSA bacteremia.  This is a 81 year old male who was here recently from 05/06/2014 to 05/11/2014.  Discharged to [facility] with PICC line for MRSA bacteremia. The patient received vancomycin and his PICC line was removed yesterday and the patient was on Vanco and Cipro. He had a PICC line placed and he was discharged to [facility] with antibiotics. Recently seen by Dr. Sampson Goon and he removed the PICC line and started antibiotics yesterday and blood cultures were drawn yesterday and today, Dr. Sampson Goon got the result and it showed MRSA bacteremia. Concerning that, Dr. Sampson Goon wanted me to admit and evaluate further. The patient will be admitted to medical service for MRSA bacteremia. They are going to start vancomycin IV, obtain echocardiogram and Dr. Sampson Goon will be consulted. The patient denies any complaints. He just complains of cough, but denies any trouble breathing. No nausea. No vomiting. Denies any fever.  The patient had left great toe osteomyelitis, status post metatarsal amputation on 05/07/2014 for osteomyelitis of the second toe and the patient's foot was healing well. The patient was seen by Dr. Sampson Goon recently during the last admission. The patient received IV vancomycin through PICC line.   PAST MEDICAL HISTORY: Significant for history of hyperlipidemia, history of left eye stroke.  PAST SURGICAL HISTORY: Amputation of the big toe, melanoma removal on the head.  ALLERGIES:  ALLERGY TO SULFA.   SOCIAL HISTORY: No smoking. No drinking. No drugs. The patient lives at [facility].   FAMILY HISTORY: Father died at age of 67 because of lung cancer. Mother died of natural causes at the age of 84.  REVIEW OF SYSTEMS:  CONSTITUTIONAL: Has some cough. No fever. No  chills. EARS, NOSE, THROAT: The patient has decreased hearing. No sore throat. No difficulty swallowing.  CARDIOVASCULAR: No chest pain. No palpitations.  GENITOURINARY: No urinary burning.  GASTROINTESTINAL: The patient has no nausea or vomiting.  CARDIOVASCULAR: No chest pain.  INTEGUMENTARY: The patient has no skin infection.  HEMATOLOGIC: No anemia, easy bruising.  PSYCHIATRIC: No anxiety or depression.  NEUROLOGICAL: No history of generalized weakness at this time.  AMBULATORY: He is wheelchair bound.   PHYSICAL EXAMINATION: VITAL SIGNS: Temperatur 98.4 F,SBP120/80, heart rate (80 02 sats  95% on room air.  GENERAL: He is alert, awake, oriented, elderly male, not in distress. Answering questions appropriately, very hard of hearing.  HEAD: Normocephalic, atraumatic.  EYES: Pupils equally reacting to light. Extraocular movements are intact. ENT: No tympanic membrane congestion. No turbinate hypertrophy. No oropharyngeal erythema.  NECK: Supple. No JVD. No carotid bruit.  CARDIOVASCULAR: S1, S2 regular. No murmurs.  LUNGS: Clear to auscultation. No wheeze, no rales.  ABDOMEN: Soft, nontender, nondistended. Bowel sounds present.  EXTREMITIES: He is status post left second toe amputation and there is no sign of ulcers and no tenderness. No erythema.  NEUROLOGIC: Cranial nerves II through XII intact. Power 5/5 in upper and lower extremities. Sensation is intact. Deep tendon reflexes 2+ bilaterally.  PSYCHIATRIC: Mood and affect are within normal limits.  LABORATORY DATA: CBC and BMP are pending and we have ordered.   ASSESSMENT AND PLAN: A 81 year old male with methicillin-resistant Staphylococcus aureus bacteremia, which is recurrent. The patient had osteomyelitis of the left second toe status post amputation. The patient has (MRSA  bacteremia despite IV antibiotics  for two weeks. The patient is admitted to medical service. Start intravenous vancomycin again. I have spoken to Dr.  Sampson GoonFitzgerald and he recommended echocardiogram, and if echo is negative for vegetations, he needs a TEE to evaluate for endocarditis and he needs another 6 weeks of IV antibiotics. I have placed a consult for Dr. Sampson GoonFitzgerald from ID to evaluate for new PICC line and choice  of antibiotics and further course. The patient had a history of MRSA bacteremia and Pseudomonas in the foot. He received Cipro as well for 2 weeks. At this time, he will need IV vancomycin and follow CBC, BMP clinical course. An echocardiogram and repeat blood cultures. Continue contact isolation.    Hyperlipemia. Continue statins.  CODE STATUS: DO NOT RESUSCITATE.   TIME SPENT: 55 minutes.   ____________________________ Katha HammingSnehalatha Armetta Henri, MD sk:sw D: 05/31/2014 12:26:03 ET T: 05/31/2014 13:06:19 ET JOB#: [id number]  cc: Katha HammingSnehalatha Jacquetta Polhamus, MD, <Dictator> Katha HammingSNEHALATHA Parrish Daddario MD ELECTRONICALLY SIGNED 06/15/2014 23:24

## 2014-10-23 NOTE — Discharge Summary (Signed)
Dates of Admission and Diagnosis:  Date of Admission 31-May-2014   Date of Discharge 08-Jun-2014   Admitting Diagnosis 1. Methicillin Resistant Staph Aureus bacteremia   Final Diagnosis 1. Methicillin Resistant Staph Aureus bacteremia 2. Anemia of Chronic Disease  3. h/o Cerebrovascular accident    Chief Complaint/History of Present Illness This is a 81 year old male sent in from Dr. Ola Spurr???s office for direct admission for MRSA bacteremia.  This is a 81 year old male who was here recently from 05/06/2014 to 05/11/2014.  Discharged to [facility] with PICC line for MRSA bacteremia. The patient received vancomycin and his PICC line was removed yesterday and the patient was on Vanco and Cipro. He had a PICC line placed and he was discharged to [facility] with antibiotics. Recently seen by Dr. Ola Spurr and he removed the PICC line and started antibiotics yesterday and blood cultures were drawn yesterday and today, Dr. Ola Spurr got the result and it showed MRSA bacteremia. Concerning that, Dr. Ola Spurr wanted me to admit and evaluate further. The patient will be admitted to medical service for MRSA bacteremia. They are going to start vancomycin IV, obtain echocardiogram and Dr. Ola Spurr will be consulted. The patient denies any complaints. He just complains of cough, but denies any trouble breathing. No nausea. No vomiting. Denies any fever.  The patient had left great toe osteomyelitis, status post metatarsal amputation on 05/07/2014 for osteomyelitis of the second toe and the patient's foot was healing well. The patient was seen by Dr. Ola Spurr recently during the last admission. The patient received IV vancomycin through PICC line.   Allergies:  Sulfa: Unknown    TDMs:  07-Dec-15 18:11   Vancomycin, Trough LAB  23  Routine Chem:  07-Dec-15 06:53   Creatinine (comp) 0.83  eGFR (African American) >60  eGFR (Non-African American) >60 (eGFR values <80m/min/1.73 m2 may  be an indication of chronic kidney disease (CKD). Calculated eGFR, using the MRDR Study equation, is useful in  patients with stable renal function. The eGFR calculation will not be reliable in acutely ill patients when serum creatinine is changing rapidly. It is not useful in patients on dialysis. The eGFR calculation may not be applicable to patients at the low and high extremes of body sizes, pregnant women, and vegetarians.)    18:11   Result Comment VANCOMYCIN,TROUGH - RESULTS VERIFIED BY REPEAT TESTING.  - C/JASON ROBBINS (PHARMACY).1910.06-07-14  - C/BRANDY DAVENPORT.1910.06-07-14.VKB  - NOTIFIED OF CRITICAL VALUE  - READ-BACK PROCESS PERFORMED.  Result(s) reported on 07 Jun 2014 at 07:15PM.   Pertinent Past History:  Pertinent Past History PAST MEDICAL HISTORY : Significant for history of hyperlipidemia, history of left eye stroke.   Hospital Course:  Hospital Course * MRSA bacteremia   Finished 2 weeks vancomycin IV as out pt.   Repeat cx positve 11/30, 12/1 and now 12/4.   IV vanc. ID to guide further therapy - pIcc line after 48 hrs negatvie cx.   Echo negative. Will get 6 weeks of abx. No TEE at this advanced age as duration would be the same. Repeat blood cx drawn 12/6 negative. Place PICC and IV abx for 6 weeks till 07/18/2013  * Recent osteomyelitis of left foot- in last admission   Wound healing properly.   On Abx.  * Hx of stroke   Aggrenox and Statin  * Hyperlipidemia   Statin.  Time spent on discharge 45 minutes   Condition on Discharge Fair   Code Status:  Code Status No Code/Do Not Resuscitate  PHYSICAL EXAM ON DISCHARGE:  Physical Exam:  GEN obese   HEENT pink conjunctivae   NECK supple    RESP normal resp effort    CARD regular rate    PSYCH alert   DISCHARGE INSTRUCTIONS HOME MEDS:  Medication Reconciliation: Patient's Home Medications at Discharge:     Medication Instructions  simvastatin 20 mg oral tablet  1 tab(s) orally  once a day (at bedtime)    centrum silver therapeutic multiple vitamins with minerals oral tablet  1 tab(s) orally once a day   aggrenox 25 mg-200 mg oral capsule, extended release  2 tab(s) orally once a day   acetaminophen 325 mg oral tablet  2 tab(s) orally every 4 hours, As needed, mild pain (1-3/10) or temp. greater than 100.4   ibuprofen 200 mg oral capsule  1 cap(s) orally every 8 hours, As Needed - for Pain   colace sodium 100 mg oral capsule  1 cap(s) orally 2 times a day   miralax - oral powder for reconstitution  17 gram(s) orally once a day   vancomycin  1500 milligram(s) IV every 24 hours till 07/18/2013     Physician's Instructions:  Diet Low Fat, Low Cholesterol   Activity Limitations As tolerated   Return to Work Not Applicable   Time frame for Follow Up Appointment 1-2 weeks  Dr. Ola Spurr   Other Comments PICC line care, Vancomycin levels and labs follow up per protocol   Electronic Signatures: Doaa Kendzierski, Lottie Dawson (MD)  (Signed 08-Dec-15 11:58)  Authored: ADMISSION DATE AND DIAGNOSIS, CHIEF COMPLAINT/HPI, Allergies, PERTINENT LABS, PERTINENT PAST HISTORY, HOSPITAL COURSE, PHYSICAL EXAM ON DISCHARGE, [place], PATIENT INSTRUCTIONS   Last Updated: 08-Dec-15 11:58 by Alba Destine (MD)

## 2014-10-23 NOTE — Discharge Summary (Signed)
PATIENT NAME:  Jordan [name], Jordan [name] MR#:  [id number] DATE OF BIRTH:  August 18, 1918  DATE OF ADMISSION:  12/22/2013 DATE OF DISCHARGE:  12/25/2013   For [name] detailed note, please take [name] look at the history and physical done on admission by Dr. Juliene PinaMody.   DIAGNOSES AT DISCHARGE: As follows:  1. Acute left great toe osteomyelitis, status post amputation.  2. Right ankle ulcer, status post wound VAC.  3. History of central vein occlusion.  4. Hyperlipidemia.   DIET: The patient is being discharged on [name] low-sodium, low-fat diet.   ACTIVITY: As tolerated.   FOLLOWUP: Follow up with Dr. Hal Moralesavid Theis in the next 1 to 2 weeks. Also follow up with Dr. Ether GriffinsFowler in next 2 weeks and follow up with Dr. Clydie Braunavid Fitzgerald from infectious disease in 2 weeks.   DISCHARGE MEDICATIONS:  1. Aggrenox 25/200 one tab daily.  2. Simvastatin 20 mg at bedtime. 3. Centrum multivitamin daily. 4. Tylenol 650 q.4 hours as needed for pain. 5. Zosyn 3.375 grams q.8 hours for 12 days, end date being 01/06/2014.   CONSULTANTS DURING THE HOSPITAL COURSE:  1. Dr. Gwyneth RevelsJustin Fowler from podiatry.  2. Dr. Clydie Braunavid Fitzgerald from infectious disease.   PERTINENT STUDIES DONE: [name] chest x-ray done on June 25th showing top of the PICC line in the distal superior vena cava.   HOSPITAL COURSE: This is [name] 81 year old male who presented to the hospital on June 23rd due to [name] right ankle ulcer and [name] left great toe ulcer, suspicious for osteomyelitis.   1. Left great toe osteomyelitis. This was likely the cause of the patient's swelling and redness of the left foot. The patient was seen by podiatry. They recommended surgical intervention. The patient therefore was taken to the OR on June 24th and had [name] left great toe amputation done. He also had [name] right ankle ulcer to which [name] wound VAC was applied. Post surgery, the patient's pain has been well controlled with just some as-needed Tylenol. His wound cultures are growing gram-negative rod, but the  sensitivities and identification are still pending. An infectious disease consult was obtained. The patient was seen by Dr. Sampson GoonFitzgerald. He recommended at least 2 weeks of IV antibiotic therapy. Therefore, [name] PICC line was placed, and therefore, the patient is being discharged on IV Zosyn. He likely needs to be switched over to some oral antibiotics after he finishes 2 weeks of IV antibiotics, but this is to be done as an outpatient through infectious disease. The patient is currently afebrile and hemodynamically stable.  2. History of left central vein occlusion. The patient's Aggrenox was held given the fact that he had surgery, but it can be resumed now.  3. Hyperlipidemia. The patient was maintained on his simvastatin, and he will resume that.   DISPOSITION: Due to his long-term wound care, he is being discharged to [name] skilled nursing facility.   CODE STATUS: The patient is [name] full code.   WOUND CARE: As per podiatry.   TIME SPENT ON DISCHARGE: 45 minutes.   ____________________________ Rolly PancakeVivek J. Cherlynn KaiserSainani, MD vjs:lb D: 12/25/2013 14:46:43 ET T: 12/25/2013 15:08:44 ET JOB#: [id number]  cc: Rolly PancakeVivek J. Cherlynn KaiserSainani, MD, <Dictator> Neomia Dearavid N. Harrington Challengerhies, MD [place] DonovanJustin [name]. Ether GriffinsFowler, DPM Stann Mainlandavid P. Sampson GoonFitzgerald, MD [place] SirenVIVEK J SAINANI MD ELECTRONICALLY SIGNED 12/29/2013 20:43

## 2014-10-23 NOTE — H&P (Signed)
PATIENT NAME:  Jordan Jordan [name], Jordan Jordan [name] DATE OF BIRTH:  02/08/1919  DATE OF ADMISSION:  05/06/2014  PRIMARY CARE PHYSICIAN:  Dr. Harrington Challengerhies.    PODIATRIST: Dr. Ether GriffinsFowler.   CHIEF COMPLAINT: Had Jordan [name] fall and could not get up today.   HISTORY OF PRESENT ILLNESS: This is Jordan [name] 81 year old man who is being treated as outpatient by Dr. Ether GriffinsFowler for an infection of the foot. He recently had Jordan [name] toe amputation on the left foot great toe, his second toe is now infected and he is having drainage from the bottom of his foot also with surrounding redness. Today he collapsed on the floor and could not get up, needed EMS to get him up. He has had severe chills, no fever, weakness. He ran out of his antibiotic he stated. Hospitalist services were contacted for further evaluation. In the ER he was found to have Jordan [name] fever, he was tachycardic on EKG, he had Jordan [name] high white count. Left foot x-ray showed osteomyelitis involving the second metatarsal head.   PAST MEDICAL HISTORY: Hyperlipidemia, left eye stroke, melanoma history.    PAST SURGICAL HISTORY: Amputation of the big toe, melanoma removal on the head.   ALLERGIES: SULFA.   MEDICATIONS: Include Percocet 5/325, 1-2 tablets every 4-6 hours as needed, Aggrenox 25/200 two tablets daily, Centrum Silver 1 tablet daily, simvastatin 20 mg at bedtime.   SOCIAL HISTORY: No smoking. No alcohol. No drug use. 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  Lives with wife who is wheelchair bound and he takes care of her.   FAMILY HISTORY: Father died at 5860 of lung cancer. Mother died of natural causes at age 79.   REVIEW OF SYSTEMS:  CONSTITUTIONAL: Positive for chills. No fever. No sweats. Positive for weakness.  EYES: He does wear glasses.  EARS, NOSE, MOUTH, AND THROAT: Decreased hearing. No sore throat. No difficulty swallowing.  CARDIOVASCULAR: No chest pain. No palpitations.  RESPIRATORY: No shortness of breath. No coughing, no sputum. No hemoptysis.  GASTROINTESTINAL:  Positive for nausea and vomiting this morning while on the floor. Abdomen, no pain. No diarrhea. No constipation. No bright red blood per rectum. No melena.  GENITOURINARY: No burning on urination or hematuria.  MUSCULOSKELETAL: No joint pain or muscle pain, does not feel pain in his toe or foot.  INTEGUMENTARY: Positive for redness on the feet, mostly on the left foot, draining area on the bottom of the foot.  NEUROLOGIC: No fainting or blackouts.  PSYCHIATRIC: No anxiety or depression.  ENDOCRINE: No thyroid problems.  HEMATOLOGIC AND LYMPHATIC: No anemia, no easy bruising or bleeding.   PHYSICAL EXAMINATION:  VITAL SIGNS: Temperature 100.3, pulse 99, respirations 18, blood pressure 131/62, pulse oximetry 91% on room air.  GENERAL: No respiratory distress, lying flat in bed.  EYES: Conjunctivae and lids normal. Pupils equal, round, and reactive to light. Extraocular muscles intact. No nystagmus.  EARS, NOSE, MOUTH, AND THROAT: Tympanic membranes, no erythema. Nasal mucosa. No erythema. Throat, no erythema, no exudate seen. Lips and gums, no lesions.  NECK: No JVD. No bruits. No lymphadenopathy. No thyromegaly. No thyroid nodules palpated.  LUNGS: Clear to auscultation. No use of accessory muscles to breathe. No rhonchi, rales, or wheeze heard.  CARDIOVASCULAR: S1, S2 normal. No gallops, rubs, or murmurs heard. Carotid upstroke 2 + bilaterally. Dorsalis pedis pulses difficult to palpate secondary to 3 + edema.  ABDOMEN: Soft, nontender. No organo-splenomegaly. Normoactive bowel sounds. No masses felt.  LYMPHATIC: No lymph nodes in the neck.  MUSCULOSKELETAL: 3 + edema. No clubbing. No cyanosis.  SKIN: Erythema on the left foot second toe and the other toes, erythema up the top of the foot and the bottom of the foot, has Jordan [name] draining sinus on the bottom of the left foot. Up Jordan [name] little further on the left leg does have healed scab, no signs of infection there. On the right leg has 2 areas of scabbing,  no signs of infection there.  NEUROLOGIC: Cranial nerves II-XII grossly intact. Deep tendon reflexes 1 + bilateral lower extremities.  PSYCHIATRIC: The patient is oriented to person, place, and time.   LABORATORY AND RADIOLOGICAL DATA: Left foot osteomyelitis involving the second metatarsal. White blood cell count 13.2, H and H 11.3 and 34.9, platelet count of 243,000. Glucose 131, BUN 25, creatinine 1.14, sodium 131, potassium 4.2, chloride 96, CO2 of 25, calcium 7.9, alkaline phosphatase 157, ALT 17, AST 32, albumin low at 2.4.  EKG, sinus tachycardia at 102 beats per minute, right bundle branch block.   ASSESSMENT AND PLAN:  1.  Clinical sepsis, osteomyelitis of the left second toe, probably infection underneath the foot. The patient has fever, tachycardia, leukocytosis. We will get aggressive with antibiotics with vancomycin and Zosyn, blood cultures and wound cultures. I spoke with Dr. Alberteen Spindle covering for Dr. Ether Griffins, they will see the patient and set up for probable transmetatarsal amputation. Surgery must be done secondary to prevent further sepsis and death. The patient is Jordan [name] high risk for surgery with age, prior stroke. No further cardiac testing needed at this point.  2.  Hyperlipidemia. Continue simvastatin.  3.  Cerebrovascular accident of the eye. Hold Aggrenox at this point.  4.  Hyponatremia and dehydration. We will give 1 liter of IV fluids and that is it.  5.  Anemia, unspecified. Continue to monitor.   TIME SPENT ON ADMISSION: 55 minutes.   CODE STATUS: The patient is Jordan [name] full code.    ____________________________ Herschell Dimes. Renae Gloss, MD rjw:bu D: 05/06/2014 15:48:46 ET T: 05/06/2014 16:16:51 ET JOB#: [id number]  cc: Herschell Dimes. Renae Gloss, MD, <Dictator> Neomia Dear. Harrington Challenger, MD [place] Donovan. Ether Griffins, DPM  Salley Scarlet MD ELECTRONICALLY SIGNED 05/13/2014 1:37

## 2014-10-23 NOTE — Consult Note (Signed)
Admit Diagnosis:   LT FOOT ULCER OSTEOMYELITIS: Onset Date: 22-Dec-2013, Status: Active, Description: LT FOOT ULCER OSTEOMYELITIS    occlusion in left eye:    right elbow fracture:    psorasis:    arthritis:    skin cancer:    pneumonia:    Left elbow surgery:   Home Medications: Medication Instructions Status  Aggrenox 25 mg-200 mg oral capsule, extended release 1 tab(s) orally once a day -pt last took on 09-01-10 Active  simvastatin 20 mg oral tablet 1 tab(s) orally once a day (at bedtime)  Active  Centrum Silver Therapeutic Multiple Vitamins with Minerals oral tablet 1 tab(s) orally once a day Active    Sulfa: Unknown   General Aspect Pt admitted with large left foot ulcer beneath his great toe joint.  Has neuropathy of unknown etilology and has been followed at wound care center with right ankle ulcer that is non-healing.  Seen approx. 1 week ago and had large full thickness to bone ulceration.  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  [facility] and is accepting admittance to the hospital with plans for surgery and likely IV antibiotics.   Case History and Physical Exam:  HEENT PERLA   Cardiovascular Palpable pulses to b/l feet.   Musculoskeletal Mild edema to b/l lower legs.  Noted cavus foot type b/l.   Neurological Pt is grossly neuropathic to both feet.   Skin Large ulcer to plantar left 1st mtpj with obivious bone and joint exposure.  Noted mild surrounding erythema.  No purlence.  On his right lower leg there is a large venous stasis ulceration likely needing debridemnt and possible graft or wound vac placement.    Impression Osteomyelitis left great toe joint ulcer. Venous stasis ulceration right lower leg.   Plan will plan for operative debridment of both wounds in the OR. Will need great toe joint amputation with possible loss of the toe on the left foot and  debridement of the right ankle wound and possible wound vac placement. NPO after midnight tomorrow night. Hold aggrenox for now. Appreciate IM assitance.   Electronic Signatures: Gwyneth RevelsFowler, Poetry Cerro (MD)  (Signed 23-Jun-15 12:32)  Authored: Health Issues, Significant Events - History, Home Medications, Allergies, General Aspect/Present Illness, History and Physical Exam, Impression/Plan   Last Updated: 23-Jun-15 12:32 by Gwyneth RevelsFowler, Akesha Uresti (MD)

## 2014-10-23 NOTE — Op Note (Signed)
PATIENT NAME:  Jordan [name], Jordan [name] MR#:  [id number] DATE OF BIRTH:  1918/12/24  DATE OF PROCEDURE:  12/23/2013  PREOPERATIVE DIAGNOSES: 1.  Left great toe joint osteomyelitis.  2.  Right medial ankle ulceration.   POSTOPERATIVE DIAGNOSES: 1.  Left great toe joint osteomyelitis. 2.  Right medial ankle ulceration.  PROCEDURES: 1.  First ray amputation, left foot.  2.  Excisional debridement via VersaJet, right medial ankle ulceration.   SURGEON: Major Santerre A. Ether Griffins, DPM.   ANESTHESIA: IV sedation with local.   HEMOSTASIS: Epinephrine 1:200,000 infiltrated along incision sites.   COMPLICATIONS: None.   SPECIMEN: Wound culture, left great toe bone and infected first ray for pathology.   ESTIMATED BLOOD LOSS: 25 mL.   OPERATIVE INDICATIONS: This is a 81 year old gentleman who has been seen in the outpatient clinic. He has noted neuropathy to bilateral lower extremities. He had previously been treated for a venous ulceration on his medial ankle. He, subsequently, developed a neuropathic ulcer under his left great toe joint that quickly opened up down to bone. There was obvious exposure of bone with erosive changes of the first metatarsal head. At this time, he was admitted to the hospital, started on IV antibiotics and elected for surgical intervention for removal of the infected bone on the left foot and debridement of the ulcer on his right ankle. The risks, benefits, alternatives and complications associated with the surgery were discussed with the patient in full and consent has been given.   OPERATIVE PROCEDURE: The patient was brought into the OR and placed on the operating table in the supine position. IV sedation was administered by the anesthesia team. A local block was placed around all areas. Attention was directed to the left foot where a dorsal incision was made over the first MTPJ. This sharp and blunt dissection was carried down to the first metatarsal. Upon evaluation of the plantar  ulceration, there was noted to be extensive erosive changes of the metatarsal head and base of the proximal phalanx. At this time, I elected to perform a partial first ray amputation. The first ray was then debrided out, and this was removed at approximately the mid shaft area of the first metatarsal. The great toe joint and distal one-half of the first metatarsal were excised. Next, the wound was then debrided away excisionally with a VersaJet. The VersaJet instrument was used to perform a deep debridement down to bone in this region. The wound was then flushed again with copious amounts of irrigation. All bleeders were Bovie cauterized as needed. The wound was packed with Stimulan calcium sulfate with vancomycin; 1 gram within the area. The incision sites were then closed dorsally and plantarly, and the wound was closed plantarly with a 3-0 nylon. This was then dressed appropriately. Attention was then then directed to the medial aspect of the right ankle where an excisional debridement with the use of a VersaJet was taken down to the subcutaneous tissue layer. This was a 3 x 5 cm ulcer, with a depth of approximately 1 cm on the medial aspect of the ankle. At this time, a wound VAC was then placed overlying the wound, and was placed at 125 mmHg. He will be readmitted to the floor with partial weight-bearing to his left heel, with bed rest for the next day or so. Continue with current antibiotics. We will change these as needed. We will consult infectious disease as the patient may need to be placed into a skilled nursing facility for IV antibiotics, as  well as physical therapy. I will see him back tomorrow to perform a dressing change on the left foot and further evaluate at that time.     ____________________________ Argentina DonovanJustin A. Ether GriffinsFowler, DPM jaf:aj D: 12/23/2013 15:59:34 ET T: 12/24/2013 02:59:13 ET JOB#: [id number]  cc: Jill AlexandersJustin A. Ether GriffinsFowler, DPM, <Dictator> Wileen Duncanson DPM ELECTRONICALLY SIGNED 01/29/2014  7:45

## 2014-10-23 NOTE — Consult Note (Signed)
Admit Diagnosis:   OSTEOMYELITIS OF LEFT FOOT: Onset Date: 06-May-2014, Status: Active, Description: OSTEOMYELITIS OF LEFT FOOT    occlusion in left eye:    right elbow fracture:    psorasis:    arthritis:    skin cancer:    pneumonia:    Left elbow surgery:   Lab Results: Routine Chem:  05-Nov-15 11:16   BUN  25  Potassium, Serum 4.2  Routine Hem:  05-Nov-15 11:16   Erythrocyte Sed Rate  58 (Result(s) reported on 06 May 2014 at 03:52PM.)  WBC (CBC)  13.2  Hemoglobin (CBC)  11.3  Hematocrit (CBC)  34.9  Platelet Count (CBC) 243   Radiology Results:  Radiology Results: XRay:    05-Nov-15 12:21, Foot Left Complete  Foot Left Complete  REASON FOR EXAM:    pain l foot  COMMENTS:       PROCEDURE: DXR - DXR FOOT LT COMP W/OBLIQUES  - May 06 2014 12:21PM     CLINICAL DATA:  Left foot pain.  Diabetic ulcers.    EXAM:  LEFT FOOT - COMPLETE 3+ VIEW    COMPARISON:  None.    FINDINGS:  Surgical absence of the left great toe and distal first metatarsal.  Poorly defined bone destruction involving the medial aspect of the  second metatarsal head. Diffuse soft tissue swelling without soft  tissue gas. No fractures or periosteal reaction. Mild inferior  calcaneal spur formation. Atheromatous arterial calcifications.     IMPRESSION:  Osteomyelitis involving the second metatarsal head.      Electronically Signed    By: Gordan PaymentSteve  Reid M.D.    On: 05/06/2014 12:51         Verified By: Darrol AngelSTEVEN H.REID, M.D.,    Sulfa: Unknown   General Aspect Pt well known to me.  Hx of neuropathy of unknown etiology that has undergone recent left foot 1st ray amputation.  Subsequently developed 2nd mtpj ulcer on left and has developed to osteomyelitis.  Has has recent episodes of weakness and back pain and seen in ER today and admitted.   Case History and Physical Exam:  Cardiovascular Palpable dp/pt pulses.   Musculoskeletal Edema to left lower leg diffusely.  s/p 1st ray  amputation left.   Neurological Noted neuropathy to b/l lower legs.   Skin Superficial right ankle medial and later ulcer.  Left 2nd mtpj with full thickness ulcer to bone with bone exposure.  Noted purulence as well.  Diffuse erythema to surrounding wound but no lymphangitic streaking.    Impression Osteomyelitis left foot.   Plan Have d/w pt and son need for TMA and tendo-achilles lenghtening to left foot.   Discussed r/b/a/c associated and consent has been given. NPO p midnight.   Electronic Signatures: Gwyneth RevelsFowler, Andrue Dini (MD)  (Signed [phone]-Nov-15 17:02)  Authored: Health Issues, Significant Events - History, Labs, Radiology Results, Allergies, General Aspect/Present Illness, History and Physical Exam, Impression/Plan   Last Updated: 05-Nov-15 17:02 by Gwyneth RevelsFowler, Lavere Stork (MD)

## 2014-10-25 LAB — SURGICAL PATHOLOGY

## 2014-10-27 NOTE — Consult Note (Signed)
PATIENT NAME:  Jordan [name], SHECKLER MR#:  [id number] DATE OF BIRTH:  11-08-1918  DATE OF CONSULTATION:  06/01/2014  REFERRING PHYSICIAN:  Katha Hamming, MD CONSULTING PHYSICIAN:  Stann Mainland. Sampson Goon, MD  INFECTIOUS DISEASE CONSULT NOTE  REASON FOR CONSULTATION: MRSA bacteremia.   HISTORY OF PRESENT ILLNESS: This is a very pleasant 81 year old gentleman whom I have been following for prior MRSA bacteremia, and Pseudomonal and MRSA foot infection, now status post amputation of his right forefoot with a transmetatarsal amputation performed by Dr. Ether Griffins, 05/07/2014. He was treated with a 2-week course of IV antibiotics for the Staphylococcus aureus bacteremia, with vancomycin with ciprofloxacin orally for the pseudomonal foot infection. Clinically, he did quite well. He was seen in clinic last week. The  forefoot amputation site was completely healed. He was doing well. PICC line was removed. Followup blood cultures were done as he had been off antibiotics for about 5 days. Unfortunately, blood cultures came back positive 2:2 MRSA. He was admitted yesterday for restart of vancomycin. He has had a TEE. Clinically, he reports feeling fine. He has had no fevers, chills, or night sweats. He has some mild back pain, but no joint pain or swelling.   PAST MEDICAL HISTORY:  1.  Melanoma on the top of his head.  2.  Hyperlipidemia.  3.  Left eye stroke.   PAST SURGICAL HISTORY: Amputation of the transmetatarsal on the right, and also melanoma removal.   ALLERGIES: SULFA.   SOCIAL HISTORY: He is at [employer], has family involved. No tobacco, alcohol or drugs.   FAMILY HISTORY: Noncontributory.   REVIEW OF SYSTEMS: Eleven systems reviewed and negative, except as per HPI.   ANTIBIOTICS SINCE ADMISSION INCLUDE: Vancomycin.   PHYSICAL EXAMINATION:  VITAL SIGNS: Temperature 97.9, pulse 96, blood pressure 131/64, respirations 18, saturation 95% on room air.  GENERAL: He is pleasant, interactive, in  no acute distress.  HEENT: Pupils equal, round and reactive to light and accommodation. Extraocular movements are intact. Sclerae anicteric. Oropharynx clear.  NECK: Supple.  HEART: Regular.  LUNGS: Clear to auscultation bilaterally.  ABDOMEN: Soft, nontender, nondistended. No hepatosplenomegaly.  EXTREMITIES: He has a left transmetatarsal amputation site that is very well healed and approximated.   NEUROLOGIC: He is alert and oriented x 3.   DATA: White count 9.3, hemoglobin 10.9, platelets 454,000. BUN 21, creatinine 1.03. Blood cultures 05/26/2014 with 2:2 MRSA. Blood culture 05/31/2014 is pending. Echocardiogram shows EF 55% to 60%. Echocardiogram reveals EF is normal, 55% to 60%, dilated LA and RA. Mild to moderate mitral valve regurgitation. Mild to moderate tricuspid regurgitation.   IMPRESSION: A 81 year old gentleman with a history of methicillin-resistant Staphylococcus aureus bacteremia from a foot infection, now status post transmetatarsal amputation, who also had pseudomonas at the site. He was status post 2 weeks of IV antibiotics for the methicillin-resistant Staphylococcus aureus bacteremia and was doing great. PICC was pulled last week, but followup blood cultures were positive for methicillin-resistant Staphylococcus aureus.   PLAN: We will have to treat him for an endovascular source at this point, since he has failed a short course of treatment for MRSA bacteremia. Given his advanced age, I do not see a need for a TEE, since even a negative TEE would not make me shorten his antibiotic duration.   RECOMMENDATIONS:  1.  Repeat blood cultures today.  2.  If admission blood cultures and followup blood cultures are negative by 06/03/2014, he could have placement of k PICC line and be discharged on a 6-week  course of vancomycin.   Thank you for the consult. I will be glad to follow with you.    ____________________________ Stann Mainlandavid P. Sampson GoonFitzgerald, MD dpf:MT D: 06/01/2014 14:26:18  ET T: 06/01/2014 14:51:34 ET JOB#: [id number]  cc: Stann Mainlandavid P. Sampson GoonFitzgerald, MD, <Dictator> Leydy Worthey Sampson GoonFITZGERALD MD ELECTRONICALLY SIGNED 07/04/2014 21:19

## 2015-02-08 ENCOUNTER — Other Ambulatory Visit
Admission: RE | Admit: 2015-02-08 | Discharge: 2015-02-08 | Disposition: A | Discharge status: Home / Self Care | Payer: Medicare Other | Source: Ambulatory Visit | Attending: Family Medicine | Admitting: Family Medicine

## 2015-02-08 DIAGNOSIS — R197 Diarrhea, unspecified: Secondary | ICD-10-CM | POA: Diagnosis present

## 2015-02-08 LAB — C DIFFICILE QUICK SCREEN W PCR REFLEX
C DIFFICILE (CDIFF) TOXIN: NEGATIVE
C Diff antigen: POSITIVE — AB

## 2015-02-08 LAB — CLOSTRIDIUM DIFFICILE BY PCR: Toxigenic C. Difficile by PCR: POSITIVE — AB

## 2015-03-03 DEATH — deceased

## 2016-01-27 IMAGING — CT CT ABD-PELV W/O CM
2 of 4 series · 15 of 46 positions shown, 17 images · non-contrast
Comparison: No priors.

CLINICAL DATA: [AGE] male with low back pain and fever. No
history of dysuria or hematuria.

EXAM:
CT ABDOMEN AND PELVIS WITHOUT CONTRAST
TECHNIQUE: Multidetector CT imaging of the abdomen and pelvis was performed
following the standard protocol without IV contrast.

[Series 2: stone standard full · axial · 0.82mm/px · z∈[+363,+778]mm · 12 of 91 slices shown, 14 images]
[im 4/91  soft-tissue]
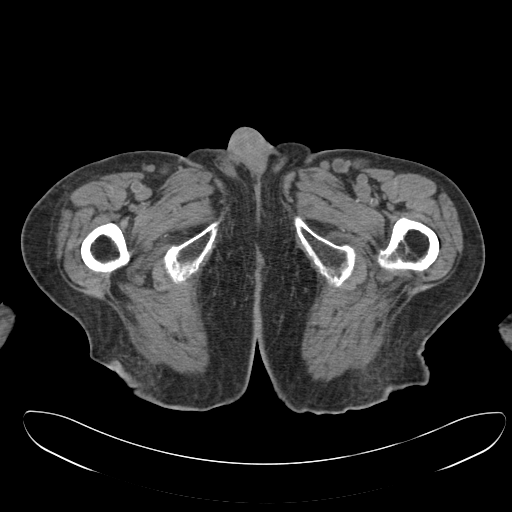
[im 4/91  bone]
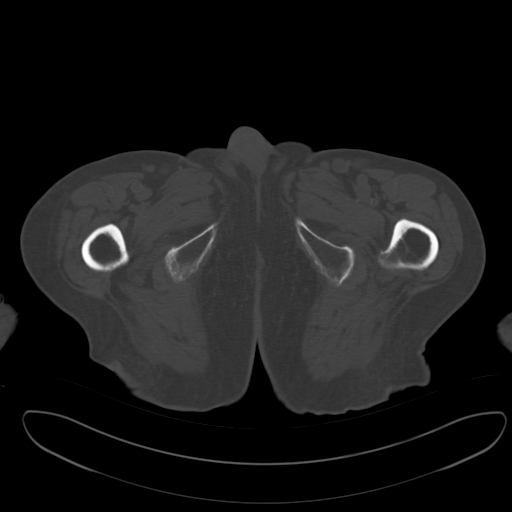
[im 12/91  soft-tissue]
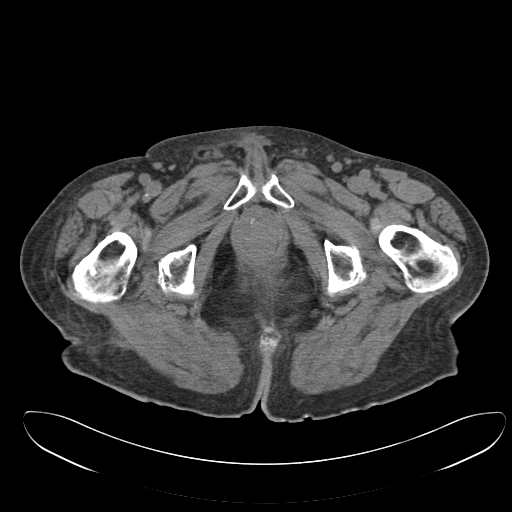
[im 19/91  soft-tissue]
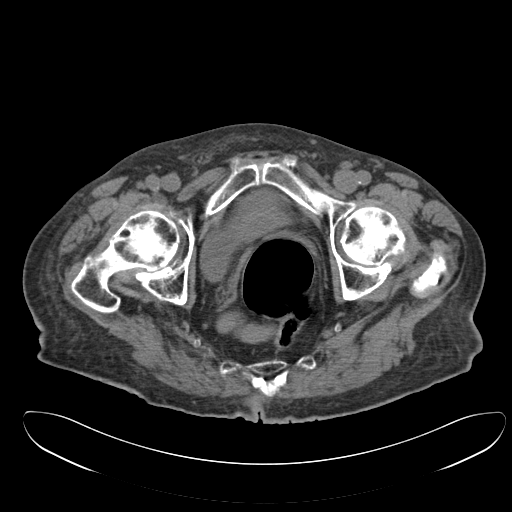
[im 27/91  soft-tissue]
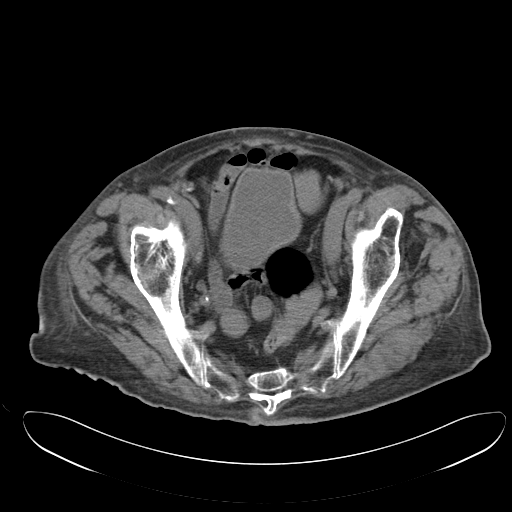
[im 34/91  soft-tissue]
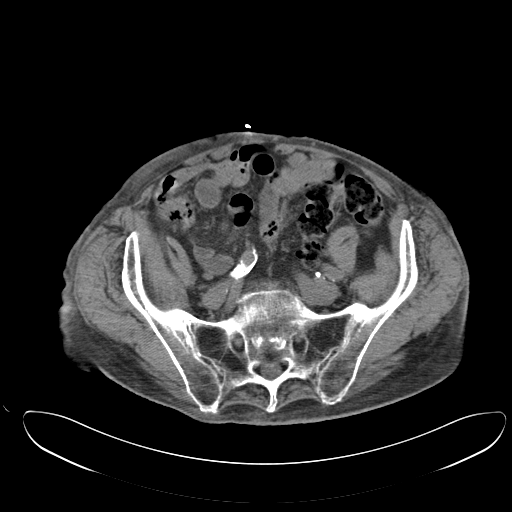
[im 42/91  soft-tissue]
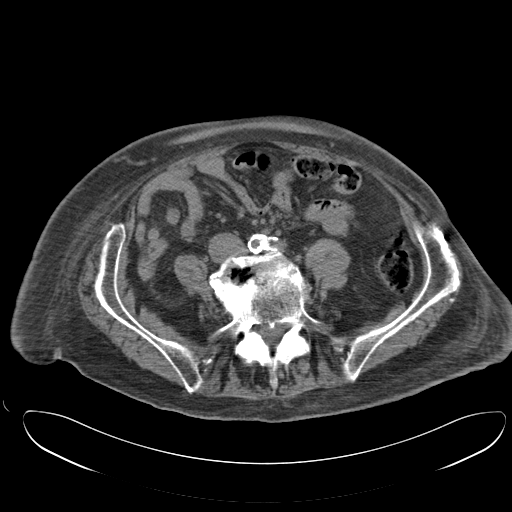
[im 49/91  soft-tissue]
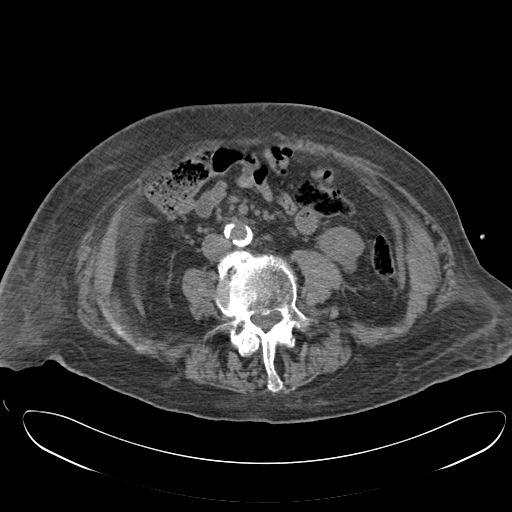
[im 57/91  soft-tissue]
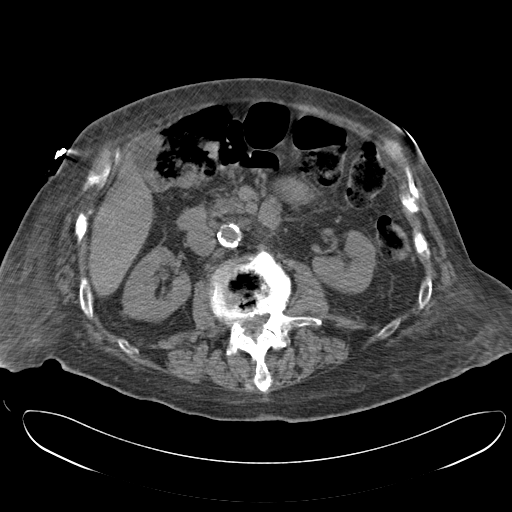
[im 64/91  soft-tissue]
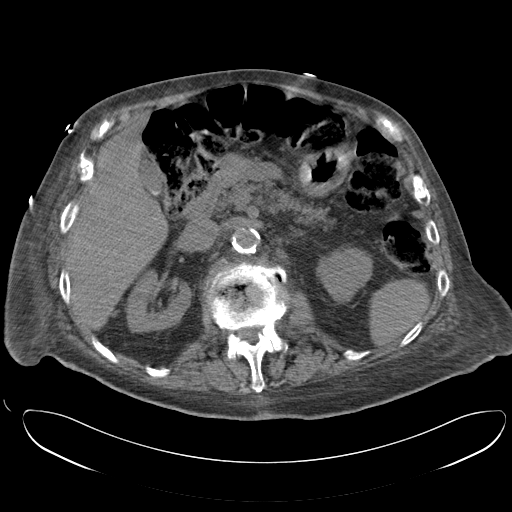
[im 64/91  bone]
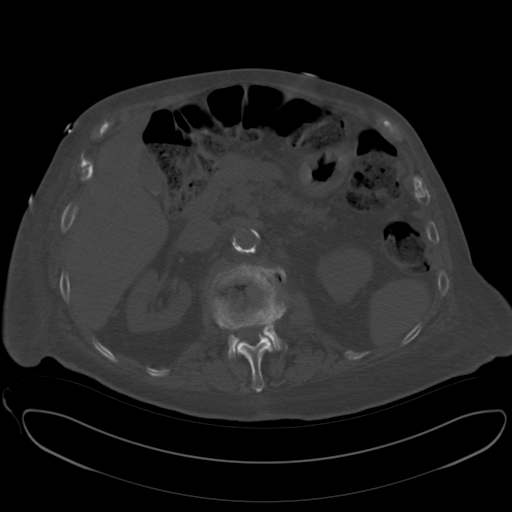
[im 72/91  soft-tissue]
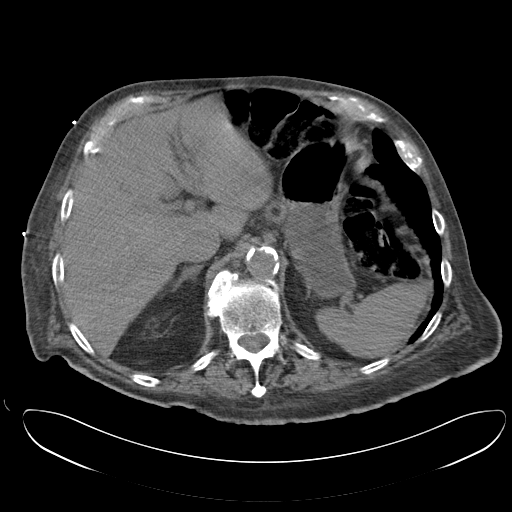
[im 79/91  soft-tissue]
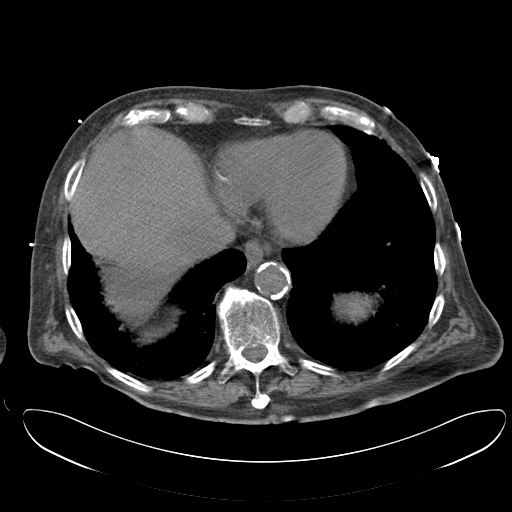
[im 87/91  soft-tissue]
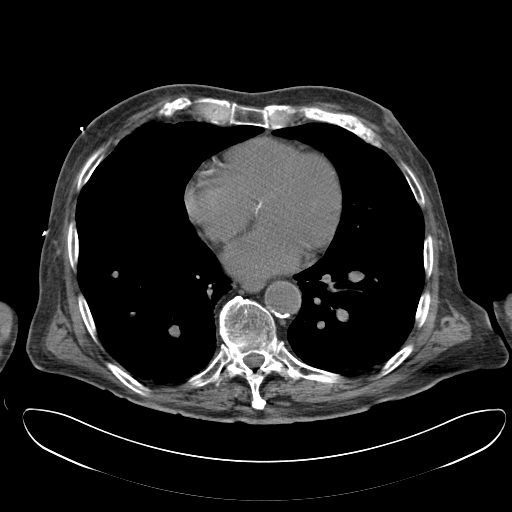

[Series 5: cor stone standard full · coronal · 0.81mm/px · 3 of 140 slices shown]
[im 47/140  soft-tissue]
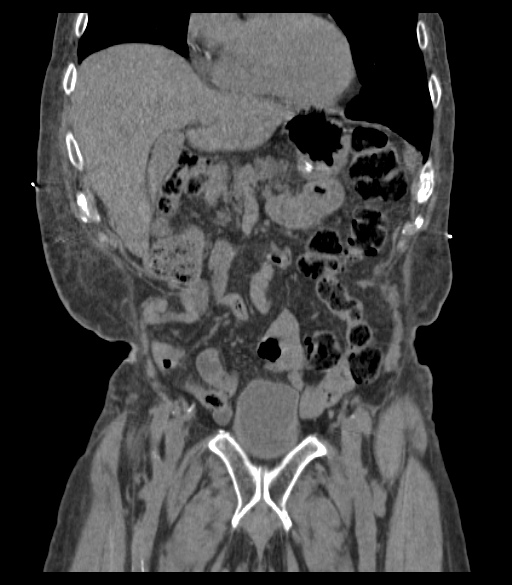
[im 62/140  soft-tissue]
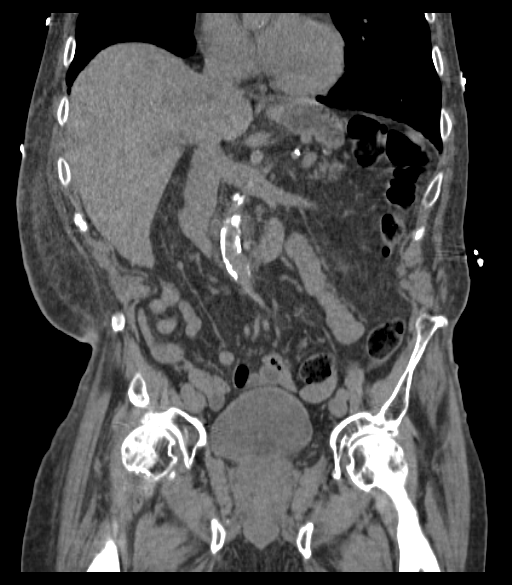
[im 78/140  soft-tissue]
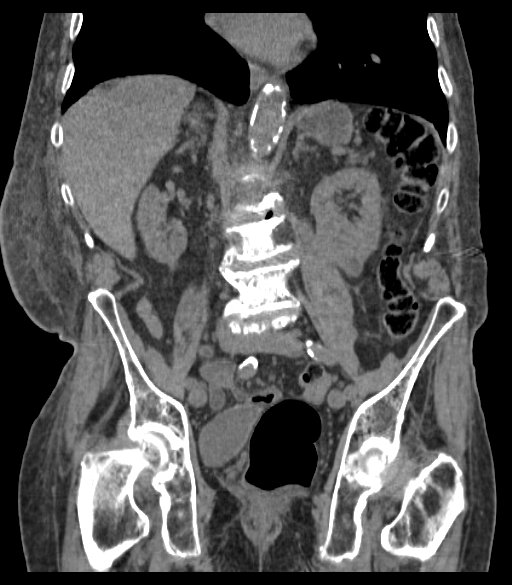

[15 of 46 positions shown; findings below may reference images not displayed]

FINDINGS: Lower chest: Patchy areas of interstitial prominence throughout the
lung bases bilaterally, as well as in the inferior aspect of the
right middle lobe where there is more extensive subpleural
reticulation. Findings are favored to reflect its post infectious or
inflammatory scarring, but could alternatively be indicative of
underlying interstitial lung disease. Atherosclerotic calcifications
in the left anterior descending, left circumflex and right coronary
arteries.

Hepatobiliary: No focal cystic or solid hepatic lesions on today's
non contrast CT examination. Small amount of high attenuation
material layering dependently in the gallbladder, presumably biliary
sludge or tiny partially calcified gallstones. No current findings
to suggest acute cholecystitis at this time.

Pancreas: Unremarkable.

Spleen: Unremarkable.

Adrenals/Urinary Tract: Bilateral adrenal glands are normal in
appearance. 1.4 cm low-attenuation lesion in the lower pole of the
left kidney is incompletely characterized on today's non contrast CT
examination, but statistically likely a small cyst. Right kidney is
unremarkable in appearance. No abnormal calcifications are noted
within the collecting system of either kidney, along the course of
either ureter, or within the lumen of the urinary bladder. No
hydroureteronephrosis to indicate urinary tract obstruction at this
time. The unenhanced appearance of the urinary bladder is
unremarkable.

Stomach/Bowel: The unenhanced appearance of the stomach is normal.
No pathologic dilatation of small bowel or colon.

Vascular/Lymphatic: Extensive atherosclerosis throughout the
abdominal and pelvic vasculature, without definite aneurysm.
Numerous borderline enlarged retroperitoneal lymph nodes are noted,
measuring up to 9 mm in short axis (nonspecific). No definite
pathologically enlarged lymph nodes are noted in the abdomen or
pelvis on today's non contrast CT examination.

Reproductive: Prostate gland is unremarkable in appearance.

Other: No significant volume of ascites.  No pneumoperitoneum.

Musculoskeletal: Compression fracture of inferior endplate of L1
with approximately 20% loss of anterior vertebral body height. No
surrounding paravertebral soft tissue swelling to suggest that this
is an acute injury. Extensive multilevel degenerative disc disease
and facet arthropathy, most severe at L4-L5 and L5-S1. There are no
aggressive appearing lytic or blastic lesions noted in the
visualized portions of the skeleton.
IMPRESSION: 1. No acute findings in the abdomen or pelvis to account for the
patient's symptoms.
2. Chronic appearing compression fracture of the inferior endplate
of L1 with approximately 20% loss of anterior vertebral body height.
There is also multilevel degenerative disc disease and lumbar
spondylosis, most severe at L4-L5 and L5-S1.
3. The appearance of the lung bases is favored to reflect areas of
chronic post infectious or inflammatory scarring, although
underlying interstitial lung disease is difficult to exclude. If
there is any clinical concern for interstitial lung disease,
followup nonemergent high-resolution chest CT could be performed in
the near future to better evaluate these findings.
4. Extensive atherosclerosis, including at least 3 vessel coronary
artery disease.

## 2016-01-28 IMAGING — CR DG FOOT COMPLETE 3+V*L*
1 series · 3 of 3 positions shown · non-contrast
Comparison: None.

CLINICAL DATA: Left foot pain.  Diabetic ulcers.

EXAM:
LEFT FOOT - COMPLETE 3+ VIEW

[Series 1: ap · 0.17mm/px · 3 of 3 slices shown]
[im 1/3]
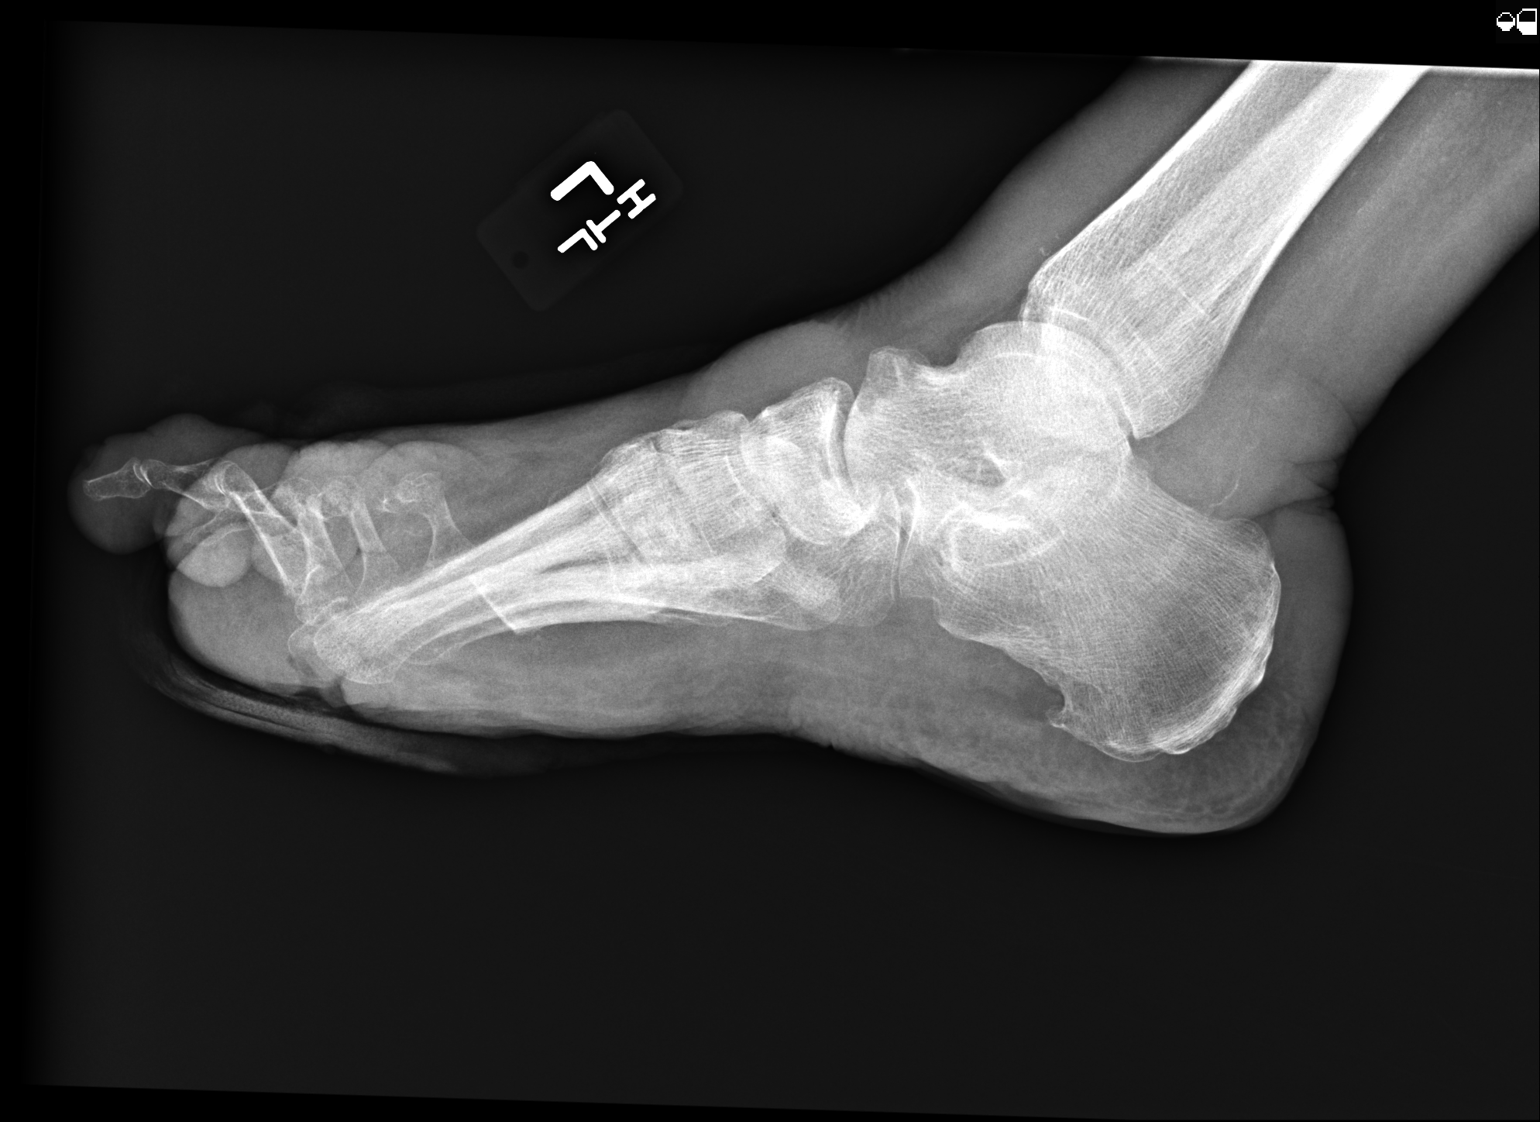
[im 2/3]
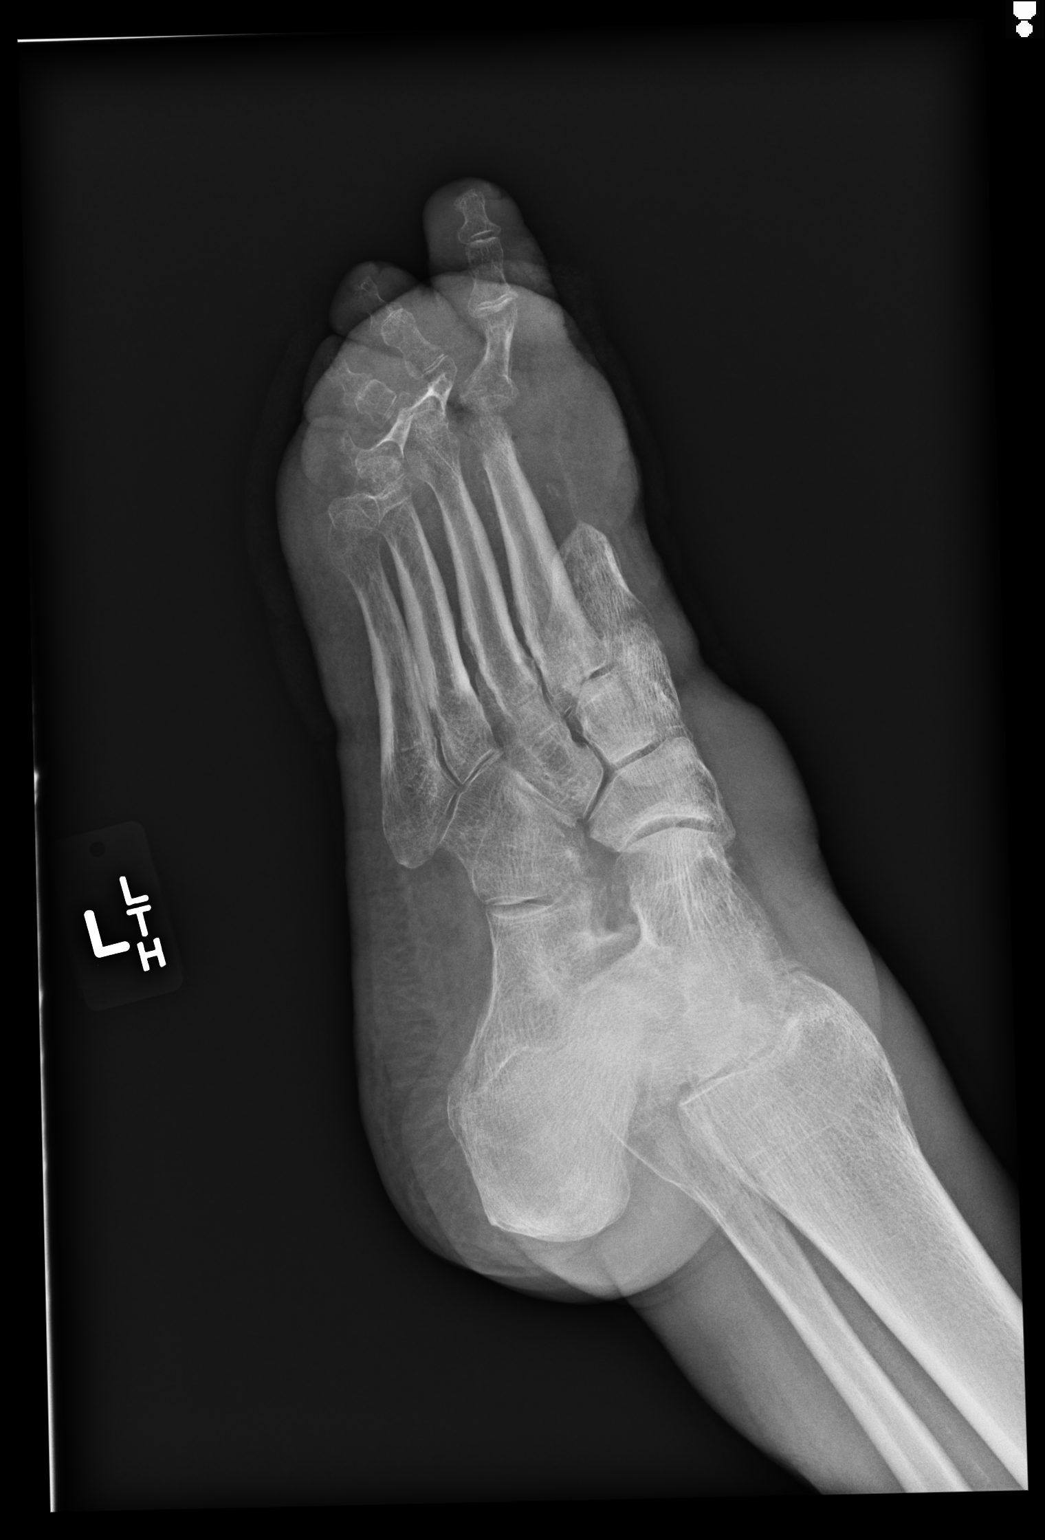
[im 3/3]
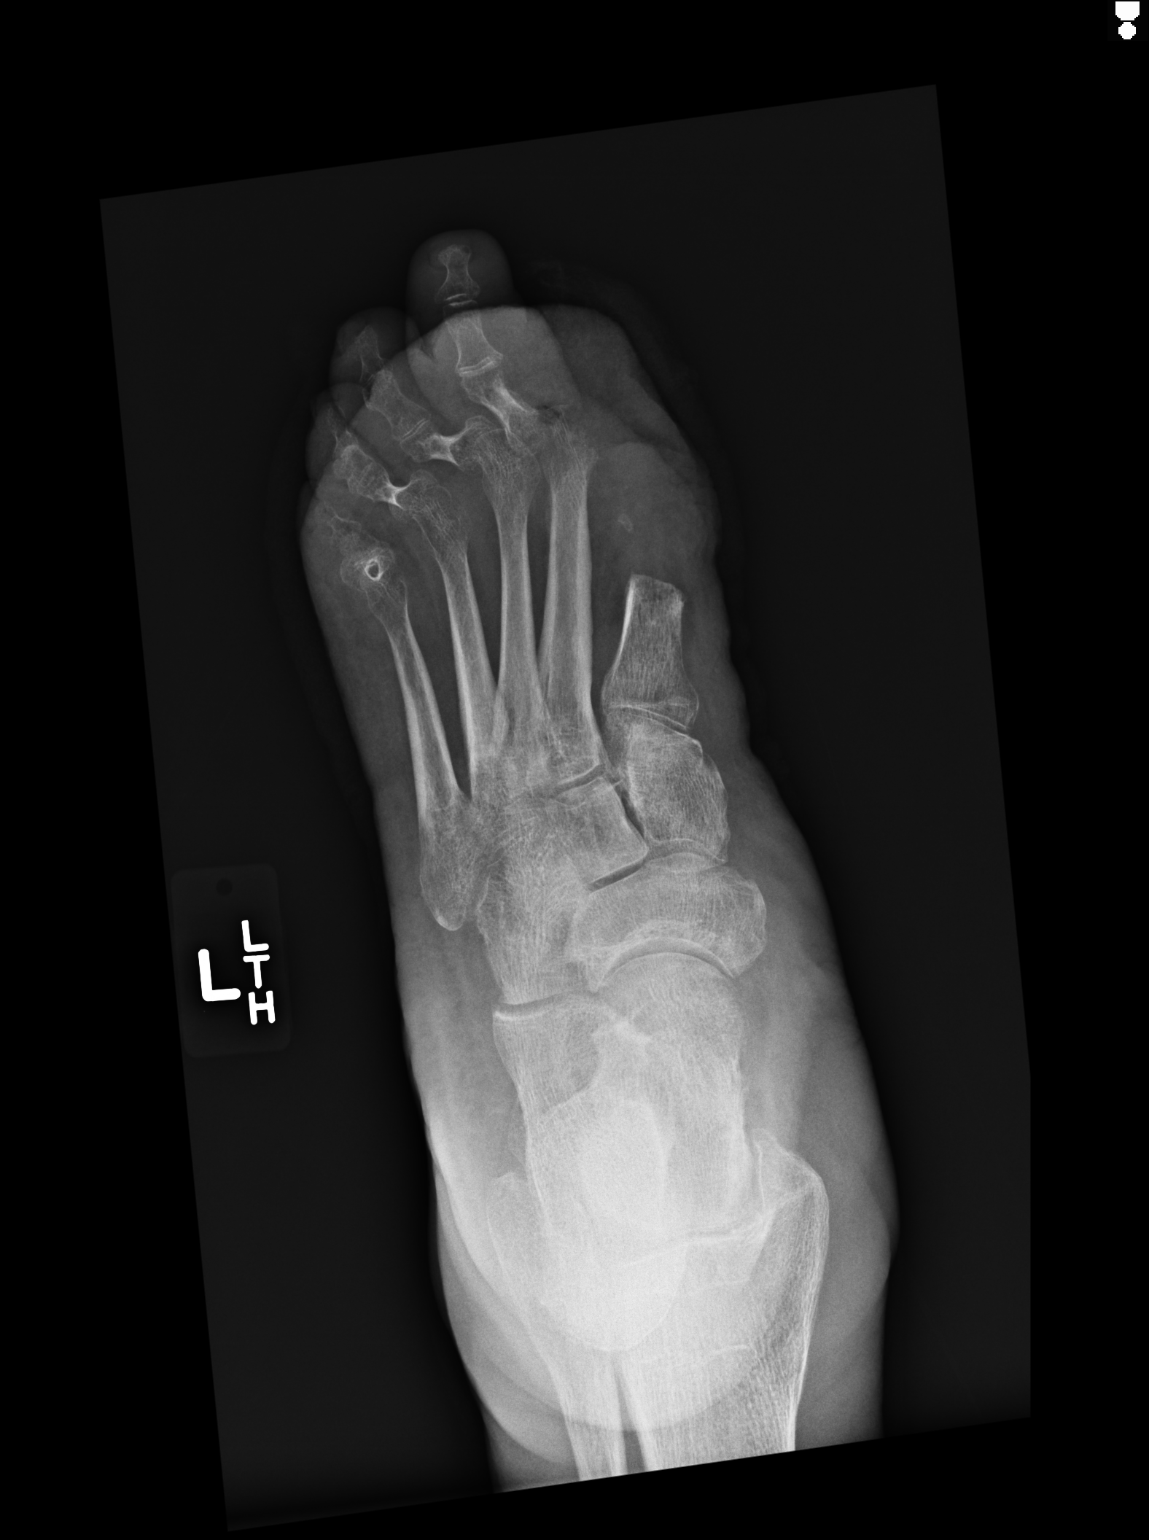

[3 of 3 positions shown; findings below may reference images not displayed]

FINDINGS: Surgical absence of the left great toe and distal first metatarsal.
Poorly defined bone destruction involving the medial aspect of the
second metatarsal head. Diffuse soft tissue swelling without soft
tissue gas. No fractures or periosteal reaction. Mild inferior
calcaneal spur formation. Atheromatous arterial calcifications.
IMPRESSION: Osteomyelitis involving the second metatarsal head.

## 2016-02-28 IMAGING — CR DG FOOT COMPLETE 3+V*L*
1 series · 3 of 3 positions shown · non-contrast
Comparison: 05/06/2014

CLINICAL DATA: Post trans metatarsal amputation

EXAM:
LEFT FOOT - COMPLETE 3+ VIEW

[Series 1: ap · 0.17mm/px · 3 of 3 slices shown]
[im 1/3]
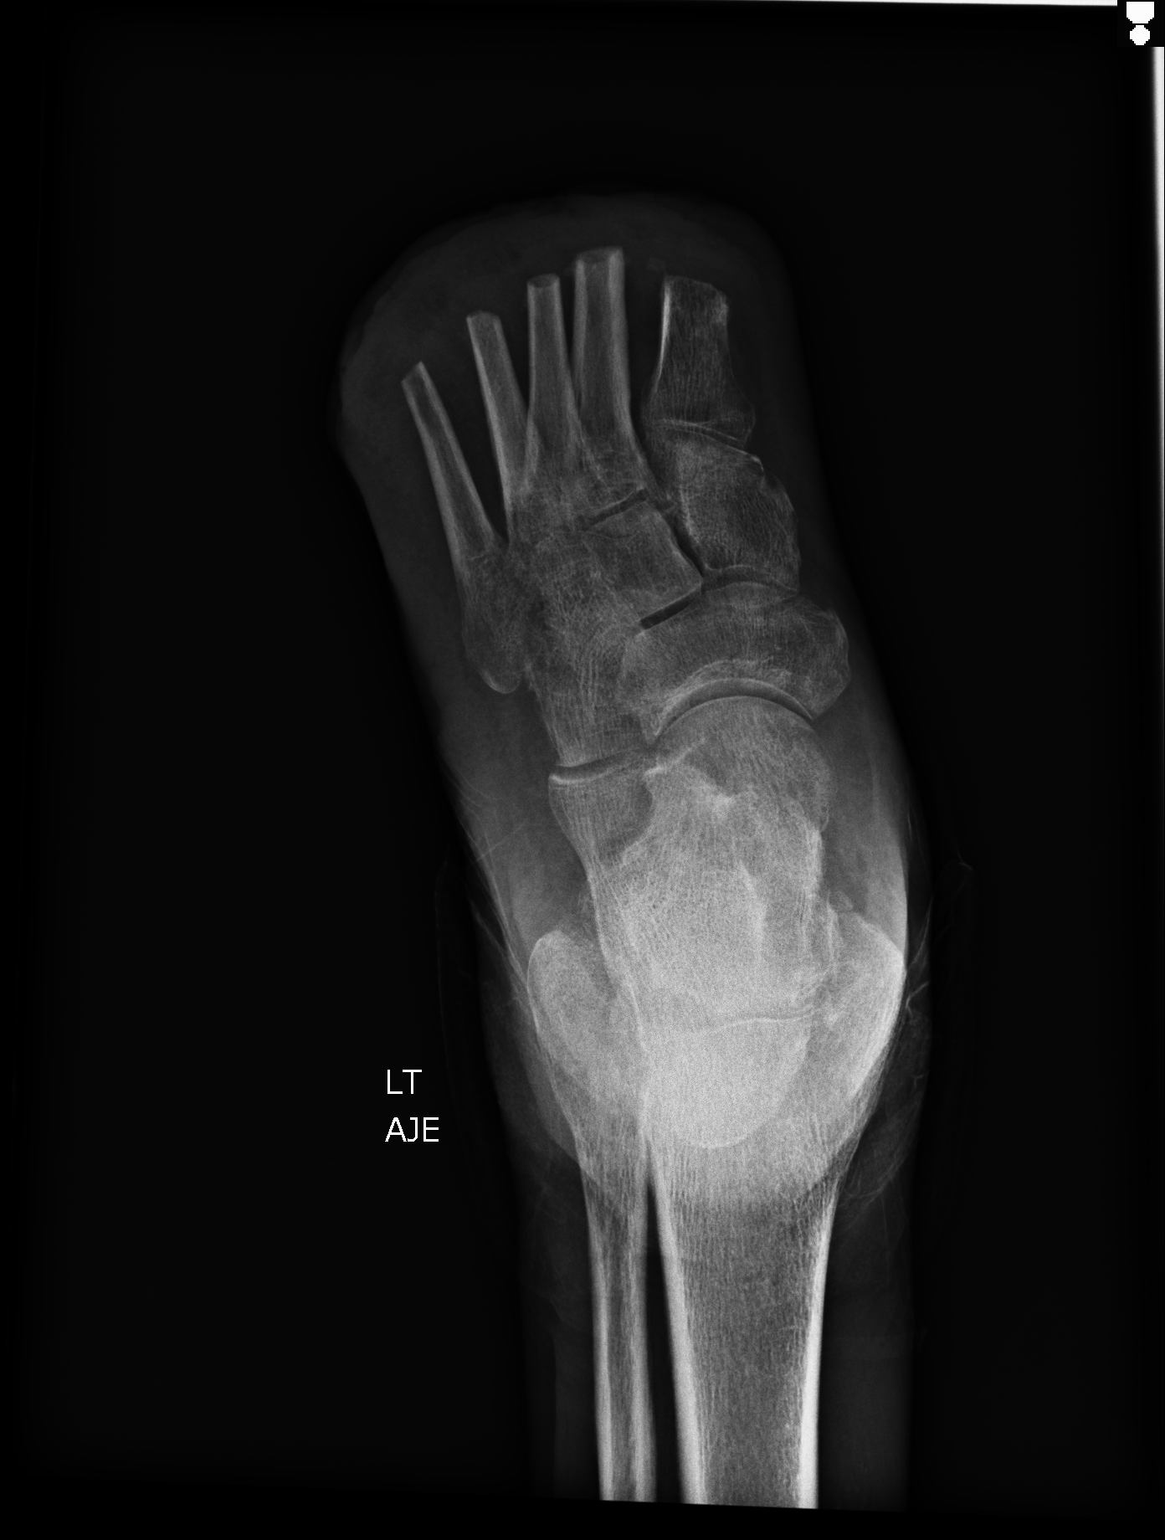
[im 2/3]
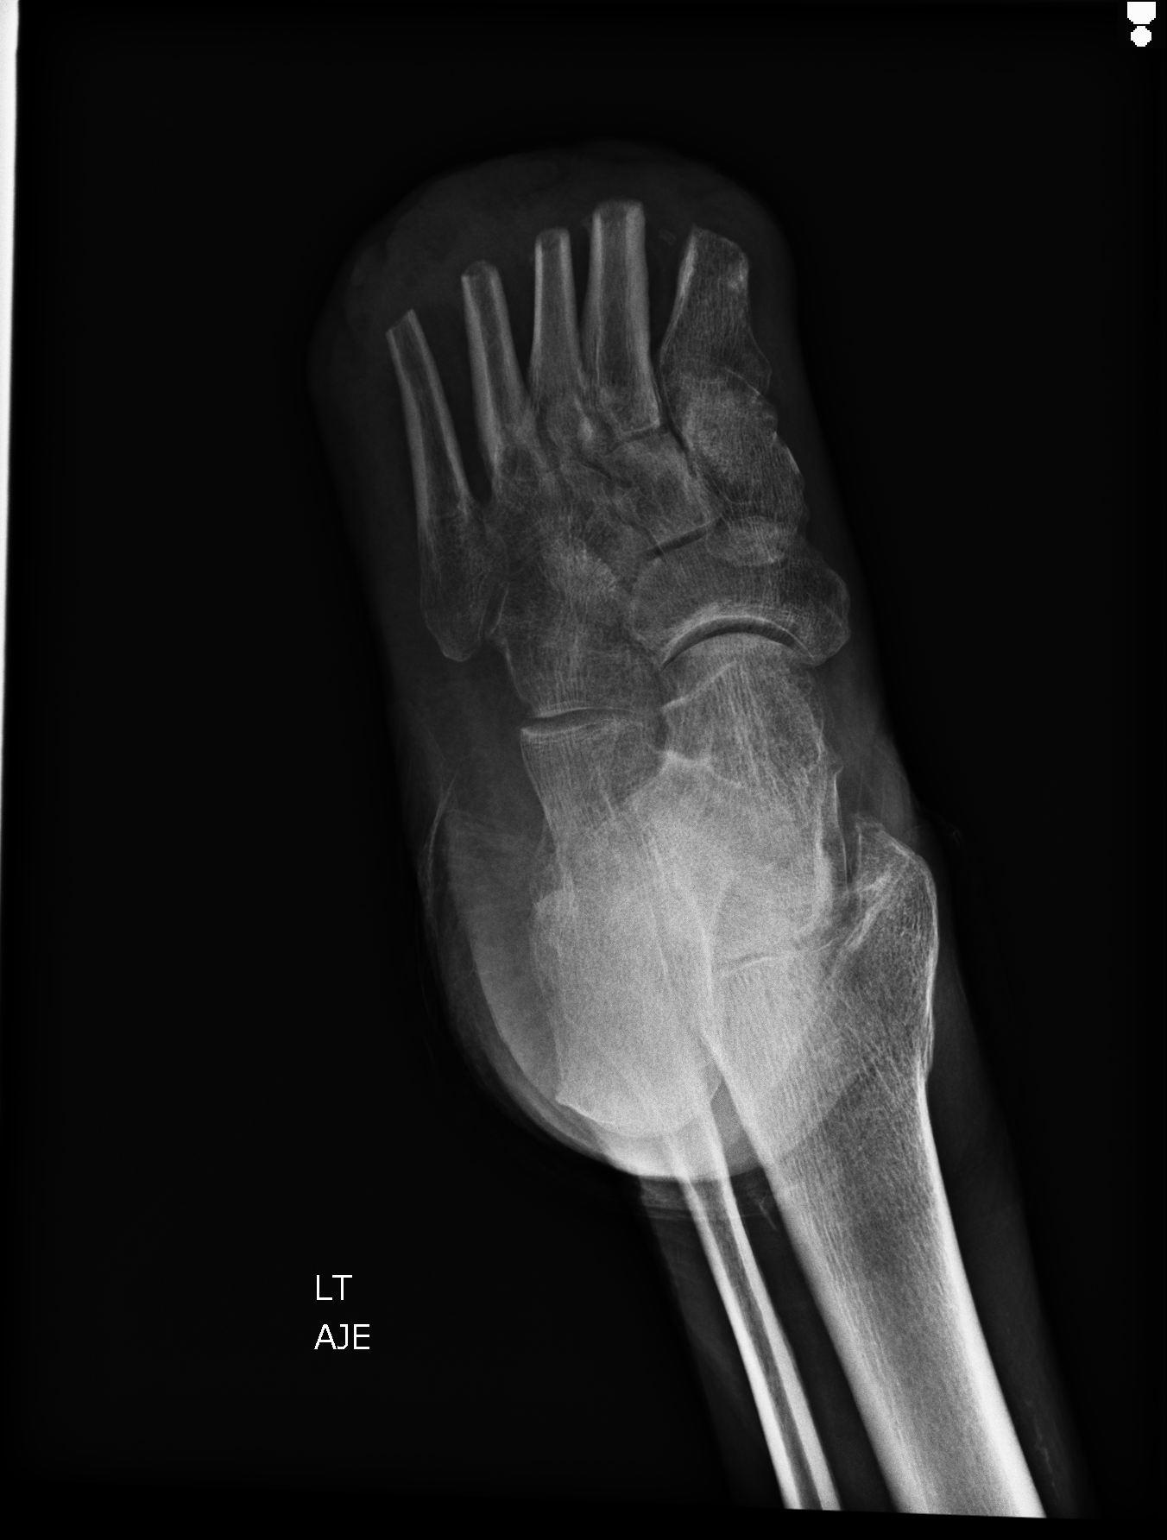
[im 3/3]
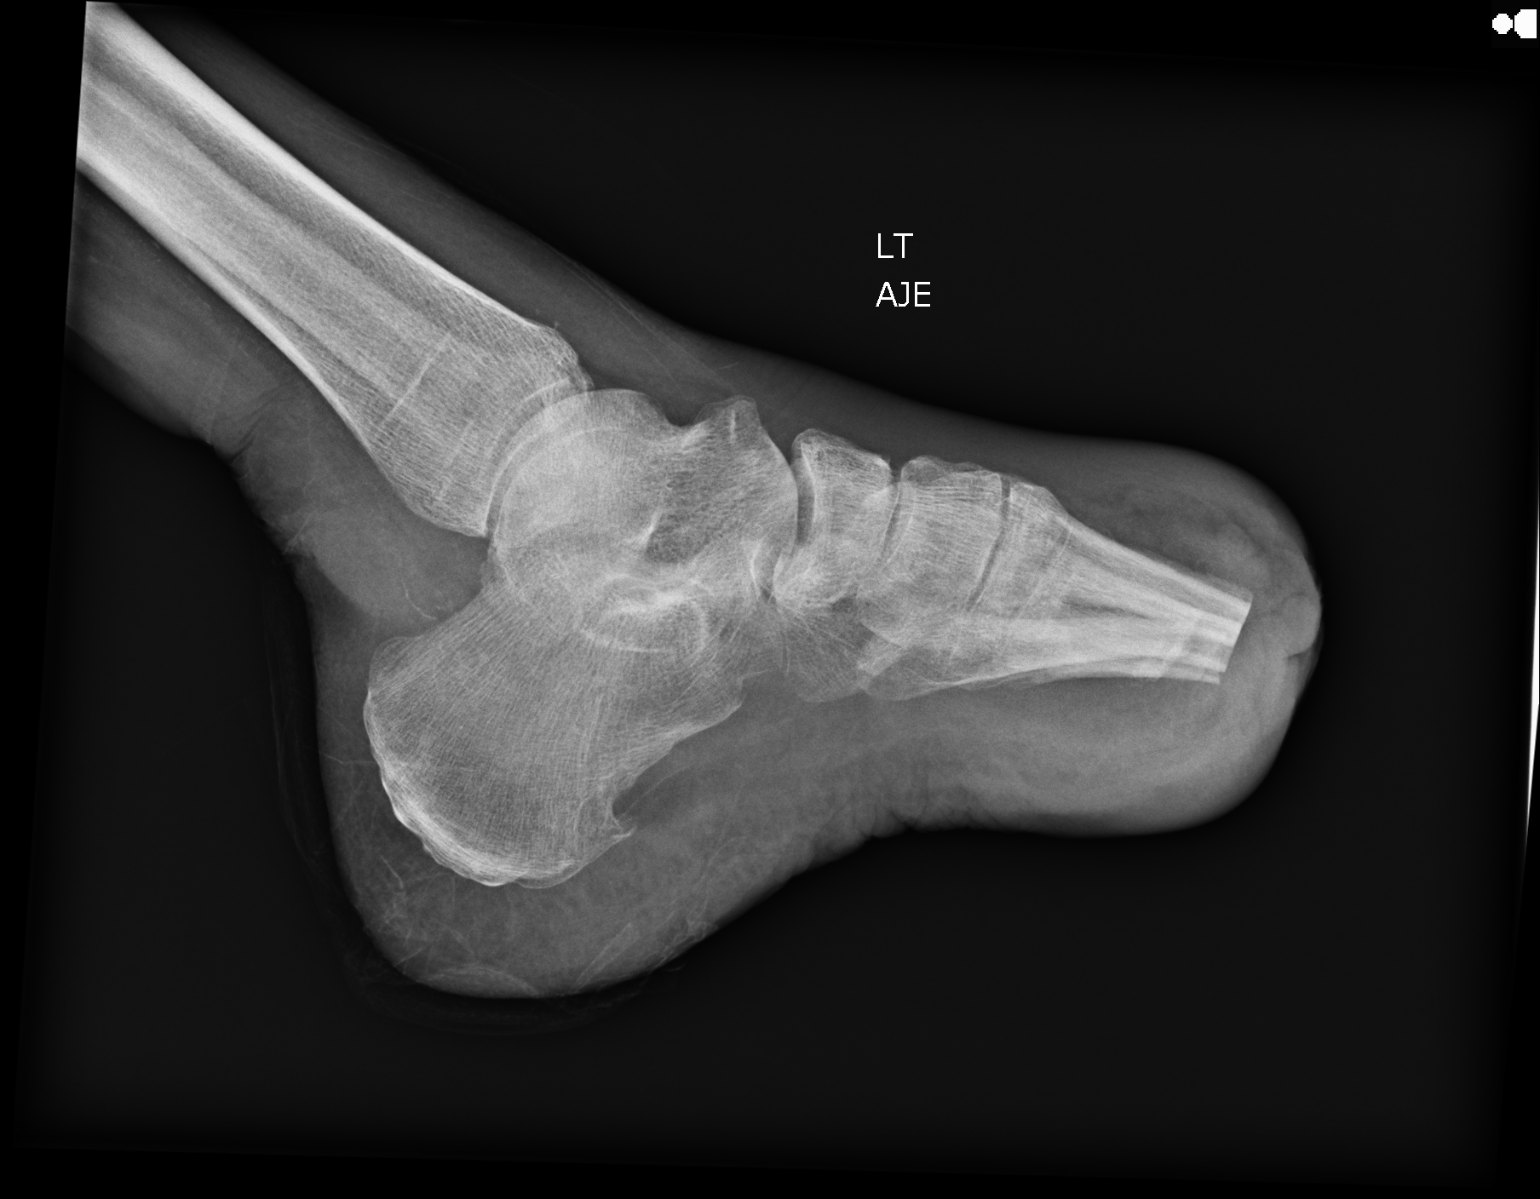

[3 of 3 positions shown; findings below may reference images not displayed]

FINDINGS: Bones are subjectively osteopenic. The patient is status post
transmetatarsal amputation of all 5 digits of the left foot. The
bones are irregular in density likely reflecting disuse osteopenia
especially in the immediate postoperative setting. Mild plantar
calcaneal spurring. No new fracture or dislocation.
IMPRESSION: Expected appearance after transmetatarsal amputation.
